# Patient Record
Sex: Female | Born: 1935 | Race: Black or African American | Hispanic: No | Marital: Single | Smoking: Never smoker
Health system: Southern US, Community
[De-identification: ages and names within clinical notes are randomized; demographics above are authoritative.]

## PROBLEM LIST (undated history)

## (undated) DIAGNOSIS — E119 Type 2 diabetes mellitus without complications: Secondary | ICD-10-CM

## (undated) DIAGNOSIS — N289 Disorder of kidney and ureter, unspecified: Secondary | ICD-10-CM

## (undated) DIAGNOSIS — I10 Essential (primary) hypertension: Secondary | ICD-10-CM

## (undated) DIAGNOSIS — I2510 Atherosclerotic heart disease of native coronary artery without angina pectoris: Secondary | ICD-10-CM

## (undated) HISTORY — PX: CORONARY ANGIOPLASTY WITH STENT PLACEMENT: SHX49

## (undated) HISTORY — PX: CARDIAC CATHETERIZATION: SHX172

---

## 2008-08-03 ENCOUNTER — Encounter (HOSPITAL_COMMUNITY): Admission: RE | Admit: 2008-08-03 | Discharge: 2008-08-29 | Payer: Self-pay | Admitting: Family Medicine

## 2008-08-03 ENCOUNTER — Ambulatory Visit (HOSPITAL_COMMUNITY): Admission: RE | Admit: 2008-08-03 | Discharge: 2008-08-03 | Payer: Self-pay | Admitting: Family Medicine

## 2008-09-20 ENCOUNTER — Encounter (HOSPITAL_COMMUNITY): Admission: RE | Admit: 2008-09-20 | Discharge: 2008-10-20 | Payer: Self-pay | Admitting: Family Medicine

## 2008-09-22 ENCOUNTER — Emergency Department (HOSPITAL_COMMUNITY): Admission: EM | Admit: 2008-09-22 | Discharge: 2008-09-22 | Payer: Self-pay | Admitting: Emergency Medicine

## 2008-10-04 ENCOUNTER — Ambulatory Visit (HOSPITAL_COMMUNITY): Admission: RE | Admit: 2008-10-04 | Discharge: 2008-10-04 | Payer: Self-pay | Admitting: Family Medicine

## 2008-10-16 ENCOUNTER — Ambulatory Visit: Payer: Self-pay | Admitting: Vascular Surgery

## 2008-11-20 ENCOUNTER — Emergency Department (HOSPITAL_COMMUNITY): Admission: EM | Admit: 2008-11-20 | Discharge: 2008-11-20 | Payer: Self-pay | Admitting: Emergency Medicine

## 2008-12-10 ENCOUNTER — Ambulatory Visit (HOSPITAL_COMMUNITY): Admission: RE | Admit: 2008-12-10 | Discharge: 2008-12-10 | Payer: Self-pay | Admitting: Vascular Surgery

## 2008-12-10 ENCOUNTER — Ambulatory Visit: Payer: Self-pay | Admitting: Vascular Surgery

## 2009-01-08 ENCOUNTER — Ambulatory Visit: Payer: Self-pay | Admitting: Vascular Surgery

## 2009-02-18 ENCOUNTER — Inpatient Hospital Stay (HOSPITAL_COMMUNITY): Admission: EM | Admit: 2009-02-18 | Discharge: 2009-02-21 | Payer: Self-pay | Admitting: Emergency Medicine

## 2009-06-10 ENCOUNTER — Emergency Department (HOSPITAL_COMMUNITY): Admission: EM | Admit: 2009-06-10 | Discharge: 2009-06-10 | Payer: Self-pay | Admitting: Emergency Medicine

## 2010-08-28 ENCOUNTER — Emergency Department (HOSPITAL_COMMUNITY)
Admission: EM | Admit: 2010-08-28 | Discharge: 2010-08-28 | Payer: Self-pay | Source: Home / Self Care | Admitting: Emergency Medicine

## 2010-09-21 ENCOUNTER — Encounter: Payer: Self-pay | Admitting: Family Medicine

## 2010-12-08 LAB — GLUCOSE, CAPILLARY
Glucose-Capillary: 112 mg/dL — ABNORMAL HIGH (ref 70–99)
Glucose-Capillary: 118 mg/dL — ABNORMAL HIGH (ref 70–99)
Glucose-Capillary: 169 mg/dL — ABNORMAL HIGH (ref 70–99)
Glucose-Capillary: 178 mg/dL — ABNORMAL HIGH (ref 70–99)
Glucose-Capillary: 193 mg/dL — ABNORMAL HIGH (ref 70–99)
Glucose-Capillary: 207 mg/dL — ABNORMAL HIGH (ref 70–99)
Glucose-Capillary: 83 mg/dL (ref 70–99)
Glucose-Capillary: 86 mg/dL (ref 70–99)

## 2010-12-08 LAB — DIFFERENTIAL
Basophils Absolute: 0 10*3/uL (ref 0.0–0.1)
Basophils Absolute: 0 10*3/uL (ref 0.0–0.1)
Basophils Relative: 1 % (ref 0–1)
Eosinophils Absolute: 0 10*3/uL (ref 0.0–0.7)
Eosinophils Absolute: 0 10*3/uL (ref 0.0–0.7)
Eosinophils Absolute: 0.1 10*3/uL (ref 0.0–0.7)
Eosinophils Absolute: 0.1 10*3/uL (ref 0.0–0.7)
Eosinophils Absolute: 0.1 10*3/uL (ref 0.0–0.7)
Eosinophils Relative: 1 % (ref 0–5)
Eosinophils Relative: 2 % (ref 0–5)
Lymphocytes Relative: 25 % (ref 12–46)
Lymphocytes Relative: 26 % (ref 12–46)
Lymphs Abs: 0.9 10*3/uL (ref 0.7–4.0)
Lymphs Abs: 1 10*3/uL (ref 0.7–4.0)
Lymphs Abs: 1 10*3/uL (ref 0.7–4.0)
Lymphs Abs: 1.1 10*3/uL (ref 0.7–4.0)
Monocytes Absolute: 0.3 10*3/uL (ref 0.1–1.0)
Monocytes Absolute: 0.3 10*3/uL (ref 0.1–1.0)
Monocytes Absolute: 0.4 10*3/uL (ref 0.1–1.0)
Monocytes Relative: 10 % (ref 3–12)
Monocytes Relative: 11 % (ref 3–12)
Monocytes Relative: 7 % (ref 3–12)
Monocytes Relative: 8 % (ref 3–12)
Neutro Abs: 2.1 10*3/uL (ref 1.7–7.7)
Neutro Abs: 2.3 10*3/uL (ref 1.7–7.7)
Neutro Abs: 2.3 10*3/uL (ref 1.7–7.7)
Neutrophils Relative %: 53 % (ref 43–77)
Neutrophils Relative %: 57 % (ref 43–77)
Neutrophils Relative %: 57 % (ref 43–77)
Promyelocytes Absolute: 0 %
nRBC: 0 /100 WBC

## 2010-12-08 LAB — CBC
HCT: 28 % — ABNORMAL LOW (ref 36.0–46.0)
HCT: 29.3 % — ABNORMAL LOW (ref 36.0–46.0)
HCT: 31.5 % — ABNORMAL LOW (ref 36.0–46.0)
Hemoglobin: 10.1 g/dL — ABNORMAL LOW (ref 12.0–15.0)
Hemoglobin: 10.8 g/dL — ABNORMAL LOW (ref 12.0–15.0)
Hemoglobin: 9.9 g/dL — ABNORMAL LOW (ref 12.0–15.0)
MCHC: 34.5 g/dL (ref 30.0–36.0)
MCHC: 34.5 g/dL (ref 30.0–36.0)
MCV: 90.4 fL (ref 78.0–100.0)
MCV: 90.6 fL (ref 78.0–100.0)
MCV: 90.8 fL (ref 78.0–100.0)
MCV: 91.1 fL (ref 78.0–100.0)
Platelets: 129 10*3/uL — ABNORMAL LOW (ref 150–400)
Platelets: 183 10*3/uL (ref 150–400)
Platelets: 191 10*3/uL (ref 150–400)
Platelets: 198 10*3/uL (ref 150–400)
Platelets: 213 10*3/uL (ref 150–400)
RBC: 3.12 MIL/uL — ABNORMAL LOW (ref 3.87–5.11)
RBC: 3.22 MIL/uL — ABNORMAL LOW (ref 3.87–5.11)
RBC: 3.43 MIL/uL — ABNORMAL LOW (ref 3.87–5.11)
RDW: 14.7 % (ref 11.5–15.5)
RDW: 15.3 % (ref 11.5–15.5)
RDW: 15.4 % (ref 11.5–15.5)
WBC: 3.6 10*3/uL — ABNORMAL LOW (ref 4.0–10.5)
WBC: 3.6 10*3/uL — ABNORMAL LOW (ref 4.0–10.5)
WBC: 3.8 10*3/uL — ABNORMAL LOW (ref 4.0–10.5)
WBC: 3.8 10*3/uL — ABNORMAL LOW (ref 4.0–10.5)
WBC: 8.5 10*3/uL (ref 4.0–10.5)

## 2010-12-08 LAB — BASIC METABOLIC PANEL
BUN: 16 mg/dL (ref 6–23)
BUN: 18 mg/dL (ref 6–23)
BUN: 22 mg/dL (ref 6–23)
CO2: 34 mEq/L — ABNORMAL HIGH (ref 19–32)
Calcium: 9.2 mg/dL (ref 8.4–10.5)
Calcium: 9.3 mg/dL (ref 8.4–10.5)
Calcium: 9.6 mg/dL (ref 8.4–10.5)
Chloride: 98 mEq/L (ref 96–112)
Chloride: 98 mEq/L (ref 96–112)
Creatinine, Ser: 0.9 mg/dL (ref 0.4–1.2)
Creatinine, Ser: 1.51 mg/dL — ABNORMAL HIGH (ref 0.4–1.2)
GFR calc Af Amer: 41 mL/min — ABNORMAL LOW (ref >60)
GFR calc Af Amer: 52 mL/min — ABNORMAL LOW (ref >60)
GFR calc Af Amer: >60 mL/min (ref >60)
GFR calc non Af Amer: 34 mL/min — ABNORMAL LOW (ref >60)
GFR calc non Af Amer: 43 mL/min — ABNORMAL LOW (ref >60)
GFR calc non Af Amer: 57 mL/min — ABNORMAL LOW (ref >60)
GFR calc non Af Amer: >60 mL/min (ref >60)
Glucose, Bld: 149 mg/dL — ABNORMAL HIGH (ref 70–99)
Glucose, Bld: 183 mg/dL — ABNORMAL HIGH (ref 70–99)
Potassium: 3.6 mEq/L (ref 3.5–5.1)
Potassium: 3.7 mEq/L (ref 3.5–5.1)
Sodium: 138 mEq/L (ref 135–145)

## 2010-12-08 LAB — URINALYSIS, ROUTINE W REFLEX MICROSCOPIC
Ketones, ur: NEGATIVE mg/dL
Specific Gravity, Urine: <1.005 — ABNORMAL LOW (ref 1.005–1.030)

## 2010-12-08 LAB — LIPID PANEL
Cholesterol: 124 mg/dL (ref 0–200)
HDL: 44 mg/dL (ref >39)
LDL Cholesterol: 69 mg/dL (ref 0–99)
Total CHOL/HDL Ratio: 2.8 RATIO

## 2010-12-08 LAB — COMPREHENSIVE METABOLIC PANEL
AST: 13 U/L (ref 0–37)
Alkaline Phosphatase: 53 U/L (ref 39–117)
BUN: 18 mg/dL (ref 6–23)
CO2: 33 mEq/L — ABNORMAL HIGH (ref 19–32)
Creatinine, Ser: 0.88 mg/dL (ref 0.4–1.2)
GFR calc non Af Amer: >60 mL/min (ref >60)
Potassium: 3.6 mEq/L (ref 3.5–5.1)
Sodium: 140 mEq/L (ref 135–145)
Total Bilirubin: 0.4 mg/dL (ref 0.3–1.2)
Total Protein: 6.6 g/dL (ref 6.0–8.3)

## 2010-12-08 LAB — PROTIME-INR
INR: 1 (ref 0.00–1.49)
INR: 1.1 (ref 0.00–1.49)
Prothrombin Time: 13.4 seconds (ref 11.6–15.2)
Prothrombin Time: 14.4 seconds (ref 11.6–15.2)

## 2010-12-08 LAB — HEMOGLOBIN A1C: Mean Plasma Glucose: 177 mg/dL

## 2010-12-10 LAB — POCT I-STAT, CHEM 8
BUN: 35 mg/dL — ABNORMAL HIGH (ref 6–23)
Hemoglobin: 11.9 g/dL — ABNORMAL LOW (ref 12.0–15.0)
Potassium: 4.2 mEq/L (ref 3.5–5.1)
Sodium: 137 mEq/L (ref 135–145)
TCO2: 29 mmol/L (ref 0–100)

## 2010-12-10 LAB — GLUCOSE, CAPILLARY
Glucose-Capillary: 109 mg/dL — ABNORMAL HIGH (ref 70–99)
Glucose-Capillary: 54 mg/dL — ABNORMAL LOW (ref 70–99)

## 2010-12-11 LAB — POCT I-STAT, CHEM 8
BUN: 12 mg/dL (ref 6–23)
Calcium, Ion: 0.97 mmol/L — ABNORMAL LOW (ref 1.12–1.32)
Chloride: 104 mEq/L (ref 96–112)
Glucose, Bld: 216 mg/dL — ABNORMAL HIGH (ref 70–99)
TCO2: 25 mmol/L (ref 0–100)

## 2011-01-13 NOTE — H&P (Signed)
[name], [name]             ACCOUNT NO.:  [id number]   MEDICAL RECORD NO.:  [id number]          PATIENT TYPE:  OBV   LOCATION:  A311                          FACILITY:  [facility]   PHYSICIAN:  Renee Ramus, MD       DATE OF BIRTH:  06-Jul-1936   DATE OF ADMISSION:  02/16/2009  DATE OF DISCHARGE:  LH                              HISTORY & PHYSICAL   HISTORY OF PRESENT ILLNESS:  The patient is a 75 year old female, who  noticed approximately 1 week prior to admission increasing swelling in  her left lower extremity.  The patient has been wrapping her left lower  extremity recently, i.e. 1 week prior to admission.  She also stopped  taking her aspirin approximately 1 month prior to admission.  The  patient has known history of a chronic venous stasis ulcer in the  lateral aspect of her left ankle, her left lower extremity, and known  severe peripheral vascular disease, status post stent to her left lower  extremity arteries.  The patient does present with a swollen left lower  extremity without a great deal of pain, but she says that it certainly  becomes more swollen and somewhat painful upon weightbearing activities.  The patient has no previous history of DVT, although she has been  checked for this several times.  The patient be admitted for  observation.   PAST MEDICAL HISTORY:  1. Peripheral vascular disease.  2. Diabetes mellitus type 2, poorly controlled.  3. Hypertension.  4. Hyperlipidemia.  5. Chronic left lower extremity ulcer.  6. Gastroesophageal reflux disease.  7. Anxiety.  8. Coronary artery disease.  9. Chronic kidney disease, stage 1.   SOCIAL HISTORY:  The patient denies alcohol or tobacco use.  She lives  at home.  She has a Warden/ranger.   FAMILY HISTORY:  Not available.   REVIEW OF SYSTEMS:  All other comprehensive review of systems are  negative.   MEDICATIONS:  The patient has allergies to CODEINE and PENICILLIN.   CURRENT MEDICATIONS:  1.  Elavil 25 mg p.o. q.i.d.  2. Metoprolol ER 200 mg p.o. b.i.d.  3. Doxycycline 100 mg p.o. b.i.d.  4. Losartan 100 mg p.o. daily.  5. Oxycodone 5 mg 1 p.o. q.6 h p.r.n. pain.  6. Loratadine 10 mg p.o. daily.  7. Tramadol/acetaminophen 50 mg p.o. q.6 h p.r.n. pain.  8. Omeprazole 20 mg p.o. daily.  9. Metolazone 2.5 mg p.o. daily.  10.Pravastatin 40 mg p.o. nightly.  11.Lasix 40 mg p.o. b.i.d.  12.Norvasc 5 mg p.o. daily.  13.Actos 45 mg p.o. daily.  14.Glipizide 5 mg p.o. daily.  15.Spironolactone 25 mg p.o. daily.  16.Metformin 1 g p.o. b.i.d.  17.Cilostazol 100 mg p.o. b.i.d.   PHYSICAL EXAMINATION:  GENERAL:  Well-developed, well-nourished, elderly  black female, currently in no apparent distress.  VITAL SIGNS:  Blood pressure 140/50, heart rate is 65, respiratory rate  22, temperature is 98.2.  HEENT:  No jugular venous distention or lymphadenopathy.  Oropharynx is  clear.  Mucous membranes pink and moist.  TMs clear bilaterally.  Pupils  equal, reactive  to light and accommodation.  Extraocular muscles are  intact.  CARDIOVASCULAR:  She has a regular rate and rhythm without murmurs,  rubs, or gallops.  PULMONARY:  Lungs are clear to auscultation bilaterally.  ABDOMEN:  Soft, somewhat obese, nontender, nondistended without  hepatosplenomegaly.  Bowel sounds are present.  She has no rebound or  guarding.  EXTREMITIES:  She has a swollen left lower calf, although she has good  distal pulses.  Her foot is warm and well-perfused.  She has evidence of  chronic venous stasis changes more extensive on the left than the right.  Her calf is noticeably swollen with comparison to left and right.  She  has no discernible palpable mass to the posterior aspect of her calf.  NEURO:  Cranial nerves II through XII are grossly intact.  She has no  focal neurological deficits.   STUDIES:  1. Chest x-ray shows no acute disease.  2. Left lower extremity shows no bony abnormalities.  3. EKG  shows normal sinus rhythm.   LABORATORIES:  White count 8.5, H and H 10/31, MCV 91, platelets 129.  Sodium 134, potassium 4.1, chloride 98, bicarb 27, BUN 31, creatinine  1.14, glucose 190, creatinine clearance of 52.   ASSESSMENT AND PLAN:  1. Swollen left lower extremity.  The patient has acute risk factors      for DVT.  We are going to begin empiric treatment with her      Coumadin.  We will obtain a D-dimer and we can get an ultrasound to      the morning, so we will check her ultrasound in the a.m. to verify      DVT.  2. Nonhealing ulcer in the left lower extremity.  We will treat with      elevation, but we will defer compression at this time.  3. Question of the cellulitis.  We will give 1 dose of vancomycin,      check ESR, and CRP, but I see no systemic evidence of cellulitis      and I do not see a visual evidence of cellulitis on this patient.  4. Hypertension.  Continue current medications.  5. Chronic kidney disease, stage 1.  We will check a UA.  6. Diabetes mellitus type 2.  We will hold metformin, Actos, and      glipizide, and treat with Lantus and Aspart.  7. Hyperlipidemia.  We will check full lipid panel and continue statin      therapy.  8. Gastroesophageal reflux disease.  Continue proton pump inhibitor.  9. Anxiety.  Continue Elavil.  10.Coronary artery disease.  We will add aspirin back to her regimen.  11.Peripheral vascular disease.  We will continue with her Pletal.  H      and P was constructed by reviewing past medical history, conferring      with emergency medical room physician, reviewing the emergency      medical record.  Time spent 1 hour.      Renee Ramus, MD  Electronically Signed     JF/MEDQ  D:  02/16/2009  T:  02/17/2009  Job:  [id number]

## 2011-01-13 NOTE — Assessment & Plan Note (Signed)
OFFICE VISIT   JAYDEN, RUDGE  DOB:  1935-09-05                                       01/08/2009  FAOZH#:[id number]   I saw the patient in the office today for continued followup of the  venous stasis ulcer of her left leg.  I had originally seen her on  10/16/2008 at which time the wound measured 4 cm in width and 8 cm in  height.  She underwent an arteriogram on 12/10/2008 and this  demonstrated severe tibial occlusive disease.  She had no significant  aortoiliac occlusive disease.  On the left side the common femoral, deep  femoral, superficial femoral and popliteal arteries were all widely  patent.  However, her anterior tibial artery was occluded and her  dominant runoff was the peroneal artery.  The posterior tibial artery  had diffuse disease but was patent.  Thus there were no options for  revascularization.  I have simply been following her wound.  She comes  in for a routine wound check.   She continues to have some pain associated with the wound and I have  written her a prescription for oxycodone for pain.  She has had no fever  or chills.   PHYSICAL EXAMINATION:  On physical examination blood pressure is 173/72,  heart rate is 76.  She has a palpable femoral pulse on the left.  I  cannot palpate pedal pulses.  She has monophasic signals in the left  foot.  The wound measures 3 cm in width and 8 cm in height and thus it  has decreased in size compared to the previous study.  She has  reasonable granulation tissue.   Unfortunately, I do not think there is anything different to do except  continued dressing changes with mild compression therapy and some  elevation as she tolerates it.  Because of her tibial occlusive disease  we cannot be overly aggressive with her compression therapy nor can she  elevate her leg as much as we would like.  However, I think she can  elevate her leg some and I have recommended she try elevating her leg on  two  pillows and will continue with dressing changes with mild  compression.  As long as the wound continues to move in the right  direction I think we can get this to heal.  If it fails to make progress  then the only other consideration would be placement of a VAC.  I plan  on seeing her back in 4 weeks.  She knows to call sooner if she has  problems.   Di Kindle. Edilia Bo, M.D.  Electronically Signed   CSD/MEDQ  D:  01/08/2009  T:  01/09/2009  Job:  [id number]   cc:   Amedeo Plenty, NP

## 2011-01-13 NOTE — Op Note (Signed)
[name], [name]             ACCOUNT NO.:  [id number]   MEDICAL RECORD NO.:  [id number]          PATIENT TYPE:  AMB   LOCATION:  SDS                          FACILITY:  [facility]   PHYSICIAN:  Di Kindle. Edilia Bo, M.D.DATE OF BIRTH:  05/31/1936   DATE OF PROCEDURE:  12/10/2008  DATE OF DISCHARGE:  12/10/2008                               OPERATIVE REPORT   PREOPERATIVE DIAGNOSIS:  Nonhealing venous stasis ulcer on the left leg.   POSTOPERATIVE DIAGNOSIS:  Nonhealing venous stasis ulcer on the left  leg.   PROCEDURE:  1. Ultrasound-guided access to the right common femoral artery.  2. Aortogram with bilateral lower extremity runoff.  3. Second-order catheterization of the left external iliac artery.  4. Percutaneous closure of right common femoral artery using Perclose      device.   SURGEON:  Di Kindle. Edilia Bo, MD   ANESTHESIA:  Local with sedation.   INDICATIONS:  This is a pleasant 75 year old woman who developed a wound  on the lateral aspect over left leg in late November.  The wound had  been slow to heal.  She had evidence of infrainguinal arterial occlusive  disease on exam and was brought in for diagnostic arteriography to  determine if she is a candidate for revascularization.   TECHNIQUE:  The patient was taken to the [facility] and received 0.5 mg of  Versed and 25 mcg of fentanyl.  She later received an additional 25 mcg  of fentanyl.  In addition, she received 10 mg of labetalol IV for blood  pressure and subsequent received a second 10 mg of labetalol.  The  groins were prepped and draped in the usual sterile fashion.  After the  skin was infiltrated with 1% lidocaine under ultrasound guidance, the  right common femoral artery was cannulated and guidewire was introduced  into the infrarenal aorta under fluoroscopic control.  The 5-French  sheath was introduced over the wire and then a pigtail catheter was  positioned at the L1 vertebral body and flush  aortogram was obtained.  The catheter was then repositioned above the bifurcation and oblique  iliac projection was obtained.  The pigtail catheter was then exchanged  for an IMA catheter which was positioned into the left common iliac  artery.  The wire was advanced down into the external iliac artery and  then the IMA catheter was exchanged for an end-hole catheter.  Next, the  left external iliac arteriogram was obtained with spot films of the left  leg.  The end-hole catheter was then removed over a wire and then right  lower extremity films were obtained to the right femoral sheath.   At the completion of the procedure, the 5-French femoral sheath was  exchanged for the Perclose device over a wire.  The Perclose was  advanced and the wire was removed.  The Perclose was then advanced  further until there was a good return from the marker port.  The foot  was then deployed and then the Perclose retracted until backbleeding  stopped.  The device was deployed and then the suture cut and the foot  re-engaged.  The catheter was then retracted and then attention was held  on the blue suture and then using the enclosed device this was used to  tie the knot and secure it.  The suture was then cut.  The pressure was  held for 5 minutes and there appeared to be good hemostasis.  There were  no immediate complications noted.   FINDINGS:  There are single renal arteries bilaterally with no  significant renal artery stenosis identified.  The infrarenal aorta,  bilateral common iliac arteries, bilateral external iliac arteries, and  common femoral arteries are patent bilaterally.   On the right side, the deep femoral artery is patent.  The superficial  femoral artery is occluded proximally and is reconstitution of the above-  knee popliteal artery.  There is single-vessel runoff on the right via  the anterior tibial artery.  The peroneal and posterior tibial arteries  on the right are  occluded.   On the left side, the common femoral, deep femoral, and superficial  femoral artery are widely patent.  There is single-vessel runoff on the  left via the peroneal artery.   CONCLUSIONS:  1. Right superficial femoral artery occlusive disease.  2. Bilateral tibial artery occlusive disease as described above.      Di Kindle. Edilia Bo, M.D.  Electronically Signed     CSD/MEDQ  D:  12/10/2008  T:  12/11/2008  Job:  [id number]   cc:   Valere Dross, NP

## 2011-01-13 NOTE — Group Therapy Note (Signed)
NAMETENISHIA, EKMAN             ACCOUNT NO.:  [id number]   MEDICAL RECORD NO.:  [id number]          PATIENT TYPE:  OBV   LOCATION:  A311                          FACILITY:  [facility]   PHYSICIAN:  Melissa L. Ladona Ridgel, MD  DATE OF BIRTH:  04/05/1936   DATE OF PROCEDURE:  02/18/2009  DATE OF DISCHARGE:                                 PROGRESS NOTE   Patient states that she had a good day.  Her leg swelling is improved.  She is not having a significant amount of pain in the leg.  She has no  nausea, vomiting, or diarrhea.  No other new symptoms are present.   PHYSICAL EXAMINATION:  VITAL SIGNS:  T max was 98.2, but currently 97.1.  Blood pressure 129/60, pulse 69, respirations 20, saturation ranges from  91 to 99% on room air.  I's and O's are incomplete.  GENERAL:  This is an obese African American female in no acute distress.  She is normocephalic and atraumatic.  Pupils are equal, round and  reactive to light.  Extraocular muscles are intact.  She has anicteric  sclerae.  External ears reveal no discharge.  Examination of the nose  reveals septum midline without discharge.  Examination of the mouth  reveals no oral lesions.  NECK:  Supple.  There is no lymphadenopathy.  No JVD.  No carotid  bruits.  CHEST:  Clear to auscultation.  There are no rales, wheezes, or rhonchi.  CARDIOVASCULAR:  Regular rate and rhythm.  Positive S1 and S2.  No S3 or  S4.  I do appreciate a 1 to 2 out of 6 systolic ejection murmur at the  left sternal border.  ABDOMEN:  Obese, nontender, nondistended with positive bowel sounds.  No  hepatosplenomegaly.  No guarding or rebound.  EXTREMITIES:  A fairly large left lateral malleolar ulcer with no smell  but some purulent material.  Pulses are decreased but present in a  dorsalis pedis, right greater than left.  Power in the upper extremities  is 5/5.  Lower extremities is limited by her wound.   PERTINENT LABORATORY VALUES:  T4 of 1.24, TSH within normal limits  at  2.791, hemoglobin A1C elevated at 7.8.  C-reactive protein is elevated  at 2.5.  Sodium is 138, potassium 3.7, chloride 98, CO2 32, BUN 18,  creatinine 0.9, and glucose is 149.  White count is 3.3 with a  hemoglobin of 9.7, hematocrit 28, platelet count 191, lipid panel within  normal limits with a total cholesterol of 124, ESR is 123.  D-dimer was  0.96.   ASSESSMENT:  1. Venous stasis ulcer:  Will order a wound care consult in the      morning for proper dressings and consider talking with Dr. Edilia Bo,      as it appears that a couple of months ago, the patient had      evaluation for possible VAC intervention, which would probably be      appropriate at this time, as the patient's wound is fairly large.      At this time, I do not see a role  for vancomycin.  There does not      appear to be any cellulitis.  She has fairly significant      hyperpigmented changes.  At this time, we are going to discontinue      her vancomycin.  I will, however, cover for the general bacteria      causing infection of this type of wound.  Patient is PENICILLIN-      allergic, so Levaquin would be appropriate.  2. Diabetes, poorly controlled.  I will add back her glipizide.      Continue her Lantus and sliding scale and try to add back further      oral hypoglycemics as condition changes.  Also will change her diet      to an 1800 ADA diet.  3. Wound care consult, as stated.  4. Hypertension:  Currently stable on her home medications.   Total time on this case was approximately 40 minutes.      Melissa L. Ladona Ridgel, MD  Electronically Signed     MLT/MEDQ  D:  02/18/2009  T:  02/18/2009  Job:  [id number]   cc:   Di Kindle. Edilia Bo, M.D.  [address]  [place]  [place] [address]

## 2011-01-13 NOTE — Consult Note (Signed)
NEW PATIENT CONSULTATION   [name], [name]  DOB:  1935/10/07                                       10/16/2008  ZOXWR#:[id number]   I saw the patient in the office today in consultation concerning a  nonhealing wound of her left leg.  She developed an ulcer along the  lateral aspect of her left leg in late November and has been undergoing  aggressive dressing changes for this since early December.  The wound  has shown no significant improvement and she is sent for vascular  consultation.  Dressing changes have included wet-to-dry dressings with  some mild compression.  She denies any previous history of DVT or  phlebitis that she is aware of.  She did have a venous Doppler study  done at [facility] which showed no evidence of DVT in either  lower extremity.  She also had an arterial Doppler study, however, they  were unable to inflate the cuff because of pain in her leg although they  demonstrated monophasic Doppler signals in both lower extremities.  Of  note, I do not get any clear-cut history of claudication or rest pain in  either lower extremity.  She has had no previous nonhealing wounds.  She  does have shooting pain associated with the left leg wound which is  relieved only with pain medication.   PAST MEDICAL HISTORY:  1. Significant for obesity.  2. Insulin dependent diabetes.  3. Hypertension.  4. Hypercholesterolemia.   She denies any history of previous myocardial infarction or history of  congestive heart failure, although she did have chest problems in 1996  and underwent PTCA at [facility].  She has had no recent symptoms.  At that  time her symptoms were indigestion with left arm pain.   FAMILY HISTORY:  Her father had open heart surgery and had bilateral  amputations, she is not sure at what age.  She had a brother who  required an amputation and also a brother who required open heart  surgery, again, she is not sure at what age.   SOCIAL HISTORY:  She is single.  She does not have any children.  She  quit tobacco in 1996 although she will smoke one to two cigarettes a  year.   REVIEW OF SYSTEMS AND MEDICATIONS:  Are documented on the medical  history form in her chart.   PHYSICAL EXAMINATION:  General:  This is a pleasant 75 year old woman  who appears her stated age.  Vital signs:  Blood pressure is 153/75,  heart rate is 73.  Neck:  Is supple.  There is no cervical  lymphadenopathy.  I do not detect any carotid bruits.  Lungs:  Are clear  bilaterally to auscultation.  Cardiac:  She has a regular rate and  rhythm.  Abdomen:  Obese and difficult to assess.  I cannot palpate an  aneurysm.  She has palpable femoral pulses.  I cannot palpate popliteal  or pedal pulses on either side.  She has monophasic Doppler signals in  both feet.  She has an ulcer along the lateral aspect of her left leg  consistent with a venous stasis ulcer which measures 4 cm in width and 8  cm in height.  She has poor granulation tissue.  There is no significant  drainage.  She has significant hyperpigmentation bilaterally but more  significantly on the left side.  She has brawny induration more so on  the left.  She has no ischemic ulcers on her feet.  Neurologic:  Exam is  nonfocal.   Based on her exam I think she has a venous stasis ulcer on the left leg  and has evidence of significant chronic venous insufficiency.  I have  explained that normally the treatment for this would be elevation and  compression therapy.  However, she has evidence of bilateral  infrainguinal arterial occlusive disease and for this reason elevating  her leg is going to make her ischemia worse, likewise compression could  worsen her ischemia.  Given that she has a nonhealing wound with  diabetes and peripheral vascular disease clearly this is a limb  threatening situation.  I think her best chance for limb salvage is to  see what options she might have for  revascularization although clearly  she would be at increased risk because of her multiple risk factors and  her diabetes.  I have recommended we proceed with arteriography to  further evaluate this and this has been scheduled for February 22.  We  have discussed the indications for arteriography and the potential  complications including but not limited to renal failure, arterial  injury and bleeding.  Obviously we will need to check her creatinine  preoperatively to be sure she has no significant renal insufficiency.  If she is a candidate for revascularization given her multiple risk  factors and previous PTCA I think she will need preoperative cardiac  evaluation.  Again, I have explained to her that this is clearly a limb  threatening situation.  Also please include with her records that get  sent to the [facility] lab her, the admission forms that she filled out with her  list of medications.   Di Kindle. Edilia Bo, M.D.  Electronically Signed   CSD/MEDQ  D:  10/16/2008  T:  10/17/2008  Job:  [id number]   cc:   Paralee Cancel, NP

## 2011-01-13 NOTE — Group Therapy Note (Signed)
[name], [name]             ACCOUNT NO.:  [id number]   MEDICAL RECORD NO.:  [id number]          PATIENT TYPE:  INP   LOCATION:  A311                          FACILITY:  [facility]   PHYSICIAN:  Melissa L. Ladona Ridgel, MD  DATE OF BIRTH:  1936-01-23   DATE OF PROCEDURE:  02/20/2009  DATE OF DISCHARGE:                                 PROGRESS NOTE   OBJECTIVE:  The patient states that her legs still feel a little bit  heavy but much improved.  She had no further episodes of shortness of  breath and she has been eating okay.  She has had a bowel movement and  is feeling somewhat better.  She denies nausea, vomiting and has had no  diarrhea.  Wound care was able to see the patient today and feels that  there is probably outpatient therapy that would benefit this patient and  so we will attempt to make these arrangements for her in the morning.  I  will follow up with radiology with regard to the finding of patchy air  space disease in the right upper lobe.  The patient is already on  antibiotics and at this time I will just confirm whether or not they  feel this represents a pneumonia.  If it does, we may elect oral  antibiotic therapy to go home.  Otherwise I was feeling that we could  likely discontinue antibiotic coverage for the ankle as the main issue  is more peripheral vascular disease with the decreased blood flow.  The  wound, however did have a foul smell initially and is subsequently  improving.   PHYSICAL EXAMINATION:  VITAL SIGNS:  The T-max was 99.1, blood pressure  123/57, pulse 72, respirations 20, saturation 98%.  There is no stool  documented.  However, the patient does report that she had a bowel  movement.  GENERAL: This a very pleasant 75 year old African American female  presents with a nonhealing left lateral malleolar vascular venous stasis  ulcer.   She continues to improve on antibiotic therapy.  She has no evidence for  pulmonary embolus as the source of her  shortness of breath.  She does  have some patchy changes in the lung that may represent a pneumonia.   ASSESSMENT:  1. Patchy air space disease in right upper lobe.  She is currently on      antibiotic therapy which would cover that.  I will talk with      radiology in the morning to see if they would confirm this as a      pneumonia.  2. Leg wound.  Appreciate the wound consult.  It sounds as if we      likely could get her to be seen in wound care clinic and follow up      with Dr. Edilia Bo in the outpatient setting.  3. No pulmonary embolus.  Continue DVT prophylactic dose of Lovenox.  4. Diabetes improved but still with a little spike.  We will continue      her glyburide and Lantus in the evening.   Total time on this case  was 20 minutes.   DISPOSITION:  If all goes well and we are able to arrange outpatient  therapy and there are no changes in her vital signs overnight, I will  consider a discharge to home.      Melissa L. Ladona Ridgel, MD  Electronically Signed     MLT/MEDQ  D:  02/20/2009  T:  02/21/2009  Job:  [id number]

## 2011-01-13 NOTE — Discharge Summary (Signed)
NAMEJORDYAN, [name]             ACCOUNT NO.:  [id number]   MEDICAL RECORD NO.:  [id number]          PATIENT TYPE:  INP   LOCATION:  A311                          FACILITY:  [facility]   PHYSICIAN:  Melissa L. Ladona Ridgel, MD  DATE OF BIRTH:  [birth year]   DATE OF ADMISSION:  02/16/2009  DATE OF DISCHARGE:  06/24/2010LH                               DISCHARGE SUMMARY   DISCHARGE DIAGNOSES:  1. Left lower extremity swelling and pain.  The patient was ruled out      for DVT with an ultrasound.  It was determined that the left      lateral wound to her leg may have been slightly infected and      causing some of this edema.  She had cellulitis and was treated      with one dose of vancomycin.  Subsequently, this was changed to      Levaquin to cover gram negatives, as there was a foul smell, and      she continued on doxycycline IV.  2. Hypertension, controlled.  Metoprolol ER, Lasix 40 mg twice daily,      Norvasc 5 mg daily, Spironolactone 25 mg daily.  3. Diabetes.  The patient's metformin was held on admission, as was      her Actos.  There may have been an element of edema related to the      Actos, as at present she is doing quite well with regard to edema.      I have elected to resume her metformin on February 23, 2009 (that is      Saturday), since she had a CT angiogram of the chest, and I have      asked her to stop her Actos and continue with her glipizide 5 mg      daily.  Her Lantus has been decreased to 50 mg at night because of      low blood sugars in the morning, so I have also asked her to make      that change.  4. Pain control.  She seems to be doing quite well with oxycodone as      needed, and she also has tramadol at home that she can use every 6      hours, but really she has not had a lot of pain unless they are      changing the dressing.  5. Peripheral vascular disease with revascularization.  The patient      will be maintained on her aspirin, Pletal, and at present,  the      patient is not on Plavix, so I would ask Dr. Edilia Bo to please      address this during the last attending's visit if she is to be on      Plavix.  For now, she should be protected with the aspirin.  6. Chronic kidney disease.  On admission, the patient's creatinine was      1.14.  During this admission, we held her metolazone because she      seemed to be diuresing well with the Lasix, and her  creatinine had      risen slightly.  I will ask her to follow up in the next week with      her primary care physician to monitor this and titrate back her      Lasix if necessary.   MEDICATIONS AT THE TIME OF DISCHARGE:  1. Elavil 25 mg four times daily.  2. Metoprolol ER 200 mg twice daily.  3. Doxycycline 100 mg by mouth twice daily x2 more days.  4. Losartan 100 by mouth daily.  5. Oxycodone 5 mg every 6 hours by mouth as needed for pain, moderate      to severe.  6. Tramadol with APAP 50 mg every 6 hours as needed for mild pain.  7. Omeprazole 20 mg once daily by mouth.  8. I will ask her not to take her metolazone if the swelling      increases.  She is to call her primary care physician.  9. Pravastatin 40 mg at bedtime.  10.Lasix 20 mg twice daily.  Please note that this is a change from      her home dose.  She usually takes 40 mg twice daily; however, she      did have a CT angiogram of her chest, and I am noting that the      creatinine has climbed slightly to 1.5, so I will ask her to do      Lasix 20 mg twice daily, in light of the fact that the swelling in      the leg is fairly well controlled and her blood pressure is stable.      I will ask her primary care physician to please follow up on her      renal function in the next week.  11.Norvasc 5 mg once daily by mouth.  I have asked her to stop her      Actos because of the increased swelling in the leg and the fact      that her blood sugars are moderately controlled.  12.Glipizide 5 mg daily.  13.Spironolactone 25  mg daily by mouth.  14.Metformin 1 gm by mouth twice daily will be restarted on February 23, 2009 since she had CT scanning on the 21st.  15.Pletal 100 mg p.o. twice daily.  16.Lantus 50 mg subcutaneously at night.  17.Aspirin 81 mg daily.  18.Levaquin 500 mg by mouth daily x2 more days.   CONSULTATIONS DURING THIS HOSPITAL COURSE:  Wound care who are  recommending if possible that [facility].  If not  possible, obviously aggressive wound care at home with follow up with  Dr. Edilia Bo in the next week.   HOSPITAL COURSE:  The patient is a pleasant 75 year old African-American  female who presented to the emergency room with increasing swelling of  the left lower extremity.  The patient had been wrapping the left leg as  recent as one week prior to admission.  She states she stopped taking  her aspirin approximately 1 month ago.  The patient has a known history  of chronic venous stasis ulcers and severe peripheral vascular disease  status post stent.  The patient does present with a swollen left leg  without a great deal of pain.  Was having difficulty with weightbearing  activities.  She was admitted to the hospital to rule her out for deep  vein thrombosis.  She underwent ultrasound testing of the leg which was  negative.  We treated the patient with continued diuresis, elevation of  the leg and antibiotic therapy, as the wound itself actually was foul  smelling with a bit of cellulitis.  She initially received doxycycline  and vancomycin.  This was changed to doxycycline and Levaquin.  The  patient continued to have stable extremity.  Wound care was able to see  her and determined that we likely could continue to do outpatient  dressing changes, and that Dr. Edilia Bo should reevaluate her soon to  determine if a vac system would be appropriate.  Since the day of  admission, the wound has improved in its appearance.  It has less  exudate, less smell, less  swelling, and the edges are starting to  involute.   During the hospital day, the patient did complain of some shortness of  breath, and despite the negative Dopplers, I did go ahead and CT scan  her chest, as her risk for PE was significant.  A CT scan of the chest  showed no PE.  It did show some right upper lobe air space disease, but  I reviewed this with the radiologist on call, and he stated he felt this  was probably just a little bit of scarring.  There is no evidence for  pneumonia.  The patient subsequently had no further shortness of breath.  She has been able to assist with her activities of daily living and has  access to home health for wound care.   PHYSICAL EXAMINATION ON DISCHARGE:  VITAL SIGNS:  On the day of  discharge, the patient's vital signs are stable.  T max was 99.1, with a  temperature of 97.4, blood pressure 121/61, pulse 72, respirations 20,  saturation 95%.  GENERAL:  This is a moderately obese 75 year old African-American female  in no acute distress.  HEENT:  She is normocephalic and atraumatic.  Pupils equal, round and  reactive to light.  Extraocular muscles are intact.  Mucous membranes  are moist.  NECK:  Supple.  There is no JVD, no lymphadenopathy, no carotid bruits.  CHEST:  Clear to auscultation.  There is no rhonchi, rales or wheezes.  CARDIOVASCULAR:  Regular rate and rhythm.  Positive S1 and S2.  No S3 or  sinus rhythm.  No murmurs, rubs, or gallops.  ABDOMEN:  Soft, nontender, nondistended with positive bowel sounds.  EXTREMITIES:  A large full thickness left lateral malleolar wound which  is actually looking a little bit better.  There is less drainage.  There  is less odor.  It is still a little bit tender, but not as much.  She  has 2+ radial pulses, 1+ peripheral pulses in the feet.   PERTINENT LABORATORIES DURING THE COURSE OF THE HOSPITAL STAY:  Sodium  138, potassium 3.4, chloride 98, CO2 is 36, BUN 11, creatinine is 1.51,  which is  up from her baseline at admission, and her glucose was 83 this  morning.  Her CBC was 3.6 with a hemoglobin of 10.1, hematocrit 29.1 and  platelets of 213.  T4 was 1.24, TSH was 2.791.  CRP was 2.5.  Hemoglobin  A1c was 7.8.  Lipid profile showed a total cholesterol of 124,  triglycerides 54, HDL 44, LDL 69 and ESR was 123.  PT was 14.4 with an  INR of 1.1.  D-dimer was 0.98.   EKG on admission showed normal sinus rhythm.  No ST-T wave changes were  noted.   RADIOLOGICAL STUDIES:  As stated, CT angiogram  on the 22nd revealed no  pulmonary emboli and some scarring in the right upper lobe.  Venous  ultrasound showed no obvious DVT.  Tibial x-ray showed no bony  abnormalities to the left lower leg, and a chest x-ray showed some  atelectasis but no obvious infiltrates.   At this time, the patient is deemed stable for discharge to home to  follow up with Dr. Edilia Bo as an outpatient in the next week, and her  primary care physician, whom I would like to have reevaluate her kidney  function status post a CT scan.   The patient will have home health changing her dressing.   DISPOSITION:  To home.   CONDITION:  Stable.   The total time on this dictation and discharge was 45 minutes.      Melissa L. Ladona Ridgel, MD  Electronically Signed     MLT/MEDQ  D:  02/21/2009  T:  02/21/2009  Job:  [id number]   cc:   Di Kindle. Edilia Bo, M.D.  [address]  [place]  [place] [address]   Amedeo Plenty

## 2011-01-13 NOTE — Group Therapy Note (Signed)
[name], [name]             ACCOUNT NO.:  [id number]   MEDICAL RECORD NO.:  [id number]          PATIENT TYPE:  INP   LOCATION:  A311                          FACILITY:  [facility]   PHYSICIAN:  Melissa L. Ladona Ridgel, MD  DATE OF BIRTH:  1935/09/22   DATE OF PROCEDURE:  02/19/2009  DATE OF DISCHARGE:                                 PROGRESS NOTE   SUBJECTIVE:  The patient states that her leg feels okay today.  It is  still tender but does feel a little bit heavy, but is less swollen.  She  has no new complaint.  She is eating okay.  She did not have any bowel  movement today.  She denies any fever or chills.  She also describes no  nausea or vomiting.  I reviewed her case with Dr. Durwin Nora today.  We will  have Wound Care re-evaluate her in the morning and if we are able to  care for her as an outpatient, she will be discharged to home.  However,  at this time, I feel it in her best interest to continue IV antibiotics  so we can determine whether a VAC is needed to repair this wound since  it is large and does have a little bit of purulent material associated  with it.  Please note that the patient did describe feeling a little bit  short of breath this morning that resolved after she got up and around.  I did note that her room air saturations on 2 occasions had been in the  low 90s.  She has not, however, been tachycardiac.   OBJECTIVE:  Currently, the patient' vital signs:  T-max was 99.2.  She  is currently 97.5.  Blood pressure 114/58, pulse 65, respirations 18,  saturation 93% to 97%.  GENERAL:  This is a pleasant, African American female who presents with  leg swelling and pain, continued nonhealing left lateral oral malleolar  ulcer secondary to peripheral vascular disease.  The patient is  currently receiving IV antibiotic therapy as the wound is initially  purulent and a little bit foul-smelling.  After Wound Care sees her in  the morning, we will decide if she an continue this as  outpatient.  However, at this time, it is prudent for her to receive IV antibiotic  therapy.  HEENT:  Normocephalic, atraumatic.  Pupils equal, round, and reactive to  light.  Extraocular muscles are intact.  Mucous membranes are moist.  NECK:  Supple with no JVD, no lymph nodes, no carotid bruits.  CHEST:  Clear to auscultation.  There is no rhonchi, rales, or wheeze.  CARDIOVASCULAR:  Regular rate and rhythm.  Positive S1 and S2.  No S3,  no S4.  No murmurs, rubs, or gallop.  ABDOMEN:  Obese, nontender, nondistended with positive bowel sounds.  EXTREMITIES:  Shows a fairly significantly sized stasis ulcer with less  of a foul smell today.  She does have a significant amount of drainage  from the site but continues to do reasonably well.   PERTINENT LABORATORY VALUES:  Her sodium today is 138, potassium 3.6,  chloride  99, CO2 35, glucose is 99, BUN is 15, creatinine 0.97.   CBC:  White count 3.8 with hemoglobin 9.9, hematocrit 28.6, and  platelets of 210,000.  Urinalysis has been negative.   ASSESSMENT AND PLAN:  This is a 75 year old African American female with  a left lateral malleolar stasis ulcer who presents with the edema and  swelling.  She has been ruled out for deep vein thrombosis and is  currently receiving IV antibiotic therapy, wound culture with wound  consult will be completed hopefully tomorrow and a decision made as to  whether or not this wound is amenable to home therapy or whether or not  she will need to move on to see Dr. Durwin Nora for possible Wound VAC.  The  patient's Doppler ultrasound was negative.  However, with a complaint of  shortness of breath, I am inclined to go ahead and do a CT of her chest  to rule out possible PE as she does have some lower saturations at this  time.  1. Lateral malleolar ulcer.  Continue localized wound care and IV      antibiotics.  We will continue the dressing changes for now until      seen by the Wound Care team.  2.  Diabetes which is poorly controlled.  I added back her glipizide      yesterday and currently her CBG this morning was 74.  We will      continue to monitor her.  She may need adjustment to her Lantus a      little bit during the night.  Currently, she is receiving Lantus 60      units at bedtime.  I think I will go ahead and decrease that down      to at least 50 so that she is not hypoglycemic in the morning.  Her      diet has been changed to 1800 ADA.  3. Hypertension.  She has been currently stable on her current home      medications.   TOTAL TIME ON THIS CASE:  30 minutes.      Melissa L. Ladona Ridgel, MD  Electronically Signed     MLT/MEDQ  D:  02/19/2009  T:  02/19/2009  Job:  [id number]

## 2013-10-18 ENCOUNTER — Encounter (HOSPITAL_COMMUNITY): Payer: Self-pay | Admitting: Emergency Medicine

## 2013-10-18 ENCOUNTER — Emergency Department (HOSPITAL_COMMUNITY)
Admission: EM | Admit: 2013-10-18 | Discharge: 2013-10-18 | Disposition: A | Discharge status: Home / Self Care | Payer: Medicare Other | Attending: Emergency Medicine | Admitting: Emergency Medicine

## 2013-10-18 ENCOUNTER — Emergency Department (HOSPITAL_COMMUNITY): Payer: Medicare Other

## 2013-10-18 DIAGNOSIS — J00 Acute nasopharyngitis [common cold]: Secondary | ICD-10-CM | POA: Insufficient documentation

## 2013-10-18 DIAGNOSIS — Z794 Long term (current) use of insulin: Secondary | ICD-10-CM | POA: Insufficient documentation

## 2013-10-18 DIAGNOSIS — Z91040 Latex allergy status: Secondary | ICD-10-CM | POA: Insufficient documentation

## 2013-10-18 DIAGNOSIS — E119 Type 2 diabetes mellitus without complications: Secondary | ICD-10-CM | POA: Insufficient documentation

## 2013-10-18 DIAGNOSIS — E669 Obesity, unspecified: Secondary | ICD-10-CM | POA: Insufficient documentation

## 2013-10-18 DIAGNOSIS — Z880 Allergy status to penicillin: Secondary | ICD-10-CM | POA: Insufficient documentation

## 2013-10-18 DIAGNOSIS — Z9889 Other specified postprocedural states: Secondary | ICD-10-CM | POA: Insufficient documentation

## 2013-10-18 DIAGNOSIS — R5383 Other fatigue: Principal | ICD-10-CM

## 2013-10-18 DIAGNOSIS — R5381 Other malaise: Secondary | ICD-10-CM | POA: Insufficient documentation

## 2013-10-18 DIAGNOSIS — Z79899 Other long term (current) drug therapy: Secondary | ICD-10-CM | POA: Insufficient documentation

## 2013-10-18 DIAGNOSIS — R531 Weakness: Secondary | ICD-10-CM

## 2013-10-18 DIAGNOSIS — Z9861 Coronary angioplasty status: Secondary | ICD-10-CM | POA: Insufficient documentation

## 2013-10-18 DIAGNOSIS — I10 Essential (primary) hypertension: Secondary | ICD-10-CM | POA: Insufficient documentation

## 2013-10-18 DIAGNOSIS — R6883 Chills (without fever): Secondary | ICD-10-CM | POA: Insufficient documentation

## 2013-10-18 HISTORY — DX: Type 2 diabetes mellitus without complications: E11.9

## 2013-10-18 HISTORY — DX: Essential (primary) hypertension: I10

## 2013-10-18 LAB — CBC WITH DIFFERENTIAL/PLATELET
BASOS PCT: 0 % (ref 0–1)
Basophils Absolute: 0 10*3/uL (ref 0.0–0.1)
EOS ABS: 0 10*3/uL (ref 0.0–0.7)
EOS PCT: 1 % (ref 0–5)
HCT: 33.6 % — ABNORMAL LOW (ref 36.0–46.0)
Hemoglobin: 11.3 g/dL — ABNORMAL LOW (ref 12.0–15.0)
Lymphocytes Relative: 20 % (ref 12–46)
Lymphs Abs: 0.9 10*3/uL (ref 0.7–4.0)
MCH: 30.9 pg (ref 26.0–34.0)
MCHC: 33.6 g/dL (ref 30.0–36.0)
MCV: 91.8 fL (ref 78.0–100.0)
Monocytes Absolute: 0.2 10*3/uL (ref 0.1–1.0)
Monocytes Relative: 5 % (ref 3–12)
NEUTROS PCT: 74 % (ref 43–77)
Neutro Abs: 3.4 10*3/uL (ref 1.7–7.7)
PLATELETS: 187 10*3/uL (ref 150–400)
RBC: 3.66 MIL/uL — ABNORMAL LOW (ref 3.87–5.11)
RDW: 13 % (ref 11.5–15.5)
WBC: 4.6 10*3/uL (ref 4.0–10.5)

## 2013-10-18 LAB — URINALYSIS, ROUTINE W REFLEX MICROSCOPIC
Bilirubin Urine: NEGATIVE
HGB URINE DIPSTICK: NEGATIVE
KETONES UR: NEGATIVE mg/dL
LEUKOCYTES UA: NEGATIVE
Nitrite: NEGATIVE
PROTEIN: 100 mg/dL — AB
Specific Gravity, Urine: >1.03 — ABNORMAL HIGH (ref 1.005–1.030)
UROBILINOGEN UA: 0.2 mg/dL (ref 0.0–1.0)
pH: 6 (ref 5.0–8.0)

## 2013-10-18 LAB — GLUCOSE, CAPILLARY: Glucose-Capillary: 311 mg/dL — ABNORMAL HIGH (ref 70–99)

## 2013-10-18 LAB — URINE MICROSCOPIC-ADD ON

## 2013-10-18 LAB — BASIC METABOLIC PANEL
BUN: 21 mg/dL (ref 6–23)
CALCIUM: 9.6 mg/dL (ref 8.4–10.5)
CO2: 24 mEq/L (ref 19–32)
Chloride: 95 mEq/L — ABNORMAL LOW (ref 96–112)
Creatinine, Ser: 1.49 mg/dL — ABNORMAL HIGH (ref 0.50–1.10)
GFR, EST AFRICAN AMERICAN: 38 mL/min — AB (ref >90)
GFR, EST NON AFRICAN AMERICAN: 33 mL/min — AB (ref >90)
Glucose, Bld: 312 mg/dL — ABNORMAL HIGH (ref 70–99)
POTASSIUM: 4.3 meq/L (ref 3.7–5.3)
SODIUM: 134 meq/L — AB (ref 137–147)

## 2013-10-18 LAB — TROPONIN I: Troponin I: <0.3 ng/mL (ref <0.30)

## 2013-10-18 NOTE — ED Provider Notes (Signed)
CSN: [id number]     Arrival date & time 10/18/13  1415 History  This chart was scribed for Donnetta Hutching, MD by Leone Payor, ED Scribe. This patient was seen in room APA09/APA09 and the patient's care was started 3:22 PM.    Chief Complaint  Patient presents with  . Weakness     The history is provided by the patient and a relative. No language interpreter was used.    HPI Comments: [name] is a 78 y.o. female who presents to the Emergency Department complaining of generalized weakness for the past few days. Pt had a cardiac catheterization and angioplasty on LAD 1 week ago in  [place]. She reports 3 other visits to [facility] ED since then. Pt also reports having fluctuating blood pressures with the highest reading of "250/195" yesterday in the [facility] ED. She also reports blood glucose levels to be in the 300-400's. She currently reports feeling cold and having chills. She denies chest pain, SOB, fever, nausea, vomiting, diarrhea, dysuria   Past Medical History  Diagnosis Date  . Hypertension   . Diabetes mellitus without complication    Past Surgical History  Procedure Laterality Date  . Cardiac catheterization    . Coronary angioplasty with stent placement     No family history on file. History  Substance Use Topics  . Smoking status: Never Smoker   . Smokeless tobacco: Not on file  . Alcohol Use: No   OB History   Grav Para Term Preterm Abortions TAB SAB Ect Mult Living                 Review of Systems  A complete 10 system review of systems was obtained and all systems are negative except as noted in the HPI and PMH.    Allergies  Erythromycin; Latex; Sulfa antibiotics; Penicillins; and Tylenol with codeine #3  Home Medications   Current Outpatient Rx  Name  Route  Sig  Dispense  Refill  . amLODipine (NORVASC) 10 MG tablet   Oral   Take 10 mg by mouth daily.         . cilostazol (PLETAL) 100 MG tablet   Oral   Take 100 mg by mouth 2 (two) times  daily.         Marland Kitchen docusate sodium (COLACE) 100 MG capsule   Oral   Take 100 mg by mouth 2 (two) times daily.         . furosemide (LASIX) 40 MG tablet   Oral   Take 40 mg by mouth daily as needed (Leg Swelling).         . hydrALAZINE (APRESOLINE) 50 MG tablet   Oral   Take 50 mg by mouth 2 (two) times daily.         Marland Kitchen ibuprofen (ADVIL,MOTRIN) 200 MG tablet   Oral   Take 200-400 mg by mouth every 6 (six) hours as needed for moderate pain.         Marland Kitchen insulin glargine (LANTUS) 100 UNIT/ML injection   Subcutaneous   Inject 30-40 Units into the skin 2 (two) times daily. Takes 40 units in the morning and 30 at bedtime.         . insulin regular (NOVOLIN R,HUMULIN R) 100 units/mL injection   Subcutaneous   Inject 15 Units into the skin 2 (two) times daily before a meal.         . losartan (COZAAR) 100 MG tablet   Oral   Take 100  mg by mouth daily.         . metoprolol succinate (TOPROL-XL) 100 MG 24 hr tablet   Oral   Take 100 mg by mouth daily. Take with or immediately following a meal.         . nitroGLYCERIN (NITROSTAT) 0.4 MG SL tablet   Sublingual   Place 0.4 mg under the tongue every 5 (five) minutes as needed for chest pain.         Marland Kitchen. omeprazole (PRILOSEC) 20 MG capsule   Oral   Take 20 mg by mouth daily.         . pravastatin (PRAVACHOL) 40 MG tablet   Oral   Take 40 mg by mouth at bedtime.         . traMADol (ULTRAM) 50 MG tablet   Oral   Take 50 mg by mouth every 6 (six) hours as needed.         Marland Kitchen. VITAMIN D, CHOLECALCIFEROL, PO   Oral   Take 1 tablet by mouth daily.          BP 148/50  Pulse 76  Temp(Src) 98.8 F (37.1 C) (Oral)  Resp 19  Ht 5\' 5"  (1.651 m)  Wt 234 lb (106.142 kg)  BMI 38.94 kg/m2  SpO2 97% Physical Exam  Nursing note and vitals reviewed. Constitutional: She is oriented to person, place, and time. She appears well-developed and well-nourished. No distress.  Pleasant  HENT:  Head: Normocephalic and  atraumatic.  Eyes: Conjunctivae and EOM are normal. Pupils are equal, round, and reactive to light.  Neck: Normal range of motion. Neck supple.  Cardiovascular: Normal rate, regular rhythm and normal heart sounds.   Pulmonary/Chest: Effort normal and breath sounds normal.  Abdominal: Soft. Bowel sounds are normal.  obese  Musculoskeletal: Normal range of motion. She exhibits edema (2+ peripheral edema ).  Neurological: She is alert and oriented to person, place, and time.  Skin: Skin is warm and dry.  Psychiatric: She has a normal mood and affect. Her behavior is normal.    ED Course  Procedures (including critical care time)  DIAGNOSTIC STUDIES: Oxygen Saturation is 94% on RA, adequate by my interpretation.    COORDINATION OF CARE: 3:25 PM Will order lab work. Discussed treatment plan with pt at bedside and pt agreed to plan.   Labs Review Labs Reviewed  GLUCOSE, CAPILLARY - Abnormal; Notable for the following:    Glucose-Capillary 311 (*)    All other components within normal limits  CBC WITH DIFFERENTIAL - Abnormal; Notable for the following:    RBC 3.66 (*)    Hemoglobin 11.3 (*)    HCT 33.6 (*)    All other components within normal limits  BASIC METABOLIC PANEL - Abnormal; Notable for the following:    Sodium 134 (*)    Chloride 95 (*)    Glucose, Bld 312 (*)    Creatinine, Ser 1.49 (*)    GFR calc non Af Amer 33 (*)    GFR calc Af Amer 38 (*)    All other components within normal limits  URINALYSIS, ROUTINE W REFLEX MICROSCOPIC - Abnormal; Notable for the following:    Specific Gravity, Urine >1.030 (*)    Glucose, UA >1000 (*)    Protein, ur 100 (*)    All other components within normal limits  URINE MICROSCOPIC-ADD ON - Abnormal; Notable for the following:    Squamous Epithelial / LPF FEW (*)    All other components within  normal limits  TROPONIN I   Imaging Review Dg Chest 2 View  10/18/2013   CLINICAL DATA:  Weakness.  EXAM: CHEST  2 VIEW  COMPARISON:  CT  ANGIO CHEST W/CM &/OR WO/CM dated 02/19/2009; DG CHEST 1V PORT dated 02/16/2009  FINDINGS: Mediastinum and hilar structures are normal. Lungs are clear. Heart size normal. No pleural effusion or pneumothorax. Degenerative changes thoracic spine. Thoracic spine scoliosis.  IMPRESSION: No active cardiopulmonary disease.   Electronically Signed   By: Maisie Fus  Register   On: 10/18/2013 16:25    EKG Interpretation    Date/Time:  Wednesday October 18 2013 15:18:22 EST Ventricular Rate:  82 PR Interval:  176 QRS Duration: 128 QT Interval:  434 QTC Calculation: 507 R Axis:   -88 Text Interpretation:  Sinus rhythm with marked sinus arrhythmia Left axis deviation Right bundle branch block Abnormal ECG When compared with ECG of 16-Feb-2009 14:51, Right bundle branch block is now Present Confirmed by Tej Murdaugh  MD, Gordie Crumby ([id number]) on 10/18/2013 4:48:07 PM            MDM   Final diagnoses:  Weakness   Patient is in no acute distress.  Labs, urinalysis, EKG all normal. Systolic blood pressure moderately elevated, but diastolic normal.   Patient has primary care followup.   I personally performed the services described in this documentation, which was scribed in my presence. The recorded information has been reviewed and is accurate.   Donnetta Hutching, MD 10/18/13 [phone]

## 2013-10-18 NOTE — Discharge Instructions (Signed)
Tests today were good. Blood pressure was good. Followup your primary care Dr.    No new medications

## 2013-10-18 NOTE — ED Notes (Addendum)
Pt reports [facility] and chest pain and was discharged last Thursday.  Reports had cardiac cath last Wednesday.  Pt says unsure if she had any stents put in.  Says the cardiologist told her she had 3 blockages and he "opened them up."    Reports since being home pt's cbg has been running in 500's and bp still elevated.  Pt c/o generalized weakness and chills.

## 2013-11-08 ENCOUNTER — Emergency Department (HOSPITAL_COMMUNITY)
Admission: EM | Admit: 2013-11-08 | Discharge: 2013-11-09 | Disposition: A | Discharge status: Home / Self Care | Payer: Medicare Other | Attending: Emergency Medicine | Admitting: Emergency Medicine

## 2013-11-08 ENCOUNTER — Emergency Department (HOSPITAL_COMMUNITY): Payer: Medicare Other

## 2013-11-08 ENCOUNTER — Encounter (HOSPITAL_COMMUNITY): Payer: Self-pay | Admitting: Emergency Medicine

## 2013-11-08 DIAGNOSIS — E042 Nontoxic multinodular goiter: Secondary | ICD-10-CM

## 2013-11-08 DIAGNOSIS — R0602 Shortness of breath: Secondary | ICD-10-CM | POA: Insufficient documentation

## 2013-11-08 DIAGNOSIS — Z91040 Latex allergy status: Secondary | ICD-10-CM | POA: Insufficient documentation

## 2013-11-08 DIAGNOSIS — K429 Umbilical hernia without obstruction or gangrene: Secondary | ICD-10-CM

## 2013-11-08 DIAGNOSIS — I10 Essential (primary) hypertension: Secondary | ICD-10-CM | POA: Insufficient documentation

## 2013-11-08 DIAGNOSIS — Z794 Long term (current) use of insulin: Secondary | ICD-10-CM | POA: Insufficient documentation

## 2013-11-08 DIAGNOSIS — R5383 Other fatigue: Secondary | ICD-10-CM

## 2013-11-08 DIAGNOSIS — R5381 Other malaise: Secondary | ICD-10-CM | POA: Insufficient documentation

## 2013-11-08 DIAGNOSIS — R1033 Periumbilical pain: Secondary | ICD-10-CM | POA: Insufficient documentation

## 2013-11-08 DIAGNOSIS — Z79899 Other long term (current) drug therapy: Secondary | ICD-10-CM | POA: Insufficient documentation

## 2013-11-08 DIAGNOSIS — R109 Unspecified abdominal pain: Secondary | ICD-10-CM

## 2013-11-08 DIAGNOSIS — E119 Type 2 diabetes mellitus without complications: Secondary | ICD-10-CM | POA: Insufficient documentation

## 2013-11-08 DIAGNOSIS — Z880 Allergy status to penicillin: Secondary | ICD-10-CM | POA: Insufficient documentation

## 2013-11-08 LAB — URINALYSIS, ROUTINE W REFLEX MICROSCOPIC
Bilirubin Urine: NEGATIVE
GLUCOSE, UA: NEGATIVE mg/dL
Hgb urine dipstick: NEGATIVE
Ketones, ur: NEGATIVE mg/dL
Leukocytes, UA: NEGATIVE
Nitrite: NEGATIVE
PH: 5 (ref 5.0–8.0)
Protein, ur: NEGATIVE mg/dL
Specific Gravity, Urine: 1.01 (ref 1.005–1.030)
Urobilinogen, UA: 0.2 mg/dL (ref 0.0–1.0)

## 2013-11-08 LAB — CBC WITH DIFFERENTIAL/PLATELET
BASOS ABS: 0 10*3/uL (ref 0.0–0.1)
Basophils Relative: 0 % (ref 0–1)
Eosinophils Absolute: 0.1 10*3/uL (ref 0.0–0.7)
Eosinophils Relative: 1 % (ref 0–5)
HCT: 32.3 % — ABNORMAL LOW (ref 36.0–46.0)
Hemoglobin: 11 g/dL — ABNORMAL LOW (ref 12.0–15.0)
Lymphocytes Relative: 21 % (ref 12–46)
Lymphs Abs: 0.9 10*3/uL (ref 0.7–4.0)
MCH: 31.1 pg (ref 26.0–34.0)
MCHC: 34.1 g/dL (ref 30.0–36.0)
MCV: 91.2 fL (ref 78.0–100.0)
Monocytes Absolute: 0.3 10*3/uL (ref 0.1–1.0)
Monocytes Relative: 7 % (ref 3–12)
NEUTROS ABS: 3.1 10*3/uL (ref 1.7–7.7)
NEUTROS PCT: 71 % (ref 43–77)
Platelets: 133 10*3/uL — ABNORMAL LOW (ref 150–400)
RBC: 3.54 MIL/uL — ABNORMAL LOW (ref 3.87–5.11)
RDW: 12.8 % (ref 11.5–15.5)
WBC: 4.4 10*3/uL (ref 4.0–10.5)

## 2013-11-08 LAB — COMPREHENSIVE METABOLIC PANEL
ALT: 13 U/L (ref 0–35)
AST: 19 U/L (ref 0–37)
Albumin: 3.5 g/dL (ref 3.5–5.2)
Alkaline Phosphatase: 83 U/L (ref 39–117)
BUN: 31 mg/dL — ABNORMAL HIGH (ref 6–23)
CALCIUM: 9.8 mg/dL (ref 8.4–10.5)
CO2: 25 mEq/L (ref 19–32)
Chloride: 101 mEq/L (ref 96–112)
Creatinine, Ser: 1.79 mg/dL — ABNORMAL HIGH (ref 0.50–1.10)
GFR calc Af Amer: 31 mL/min — ABNORMAL LOW (ref >90)
GFR calc non Af Amer: 27 mL/min — ABNORMAL LOW (ref >90)
Glucose, Bld: 208 mg/dL — ABNORMAL HIGH (ref 70–99)
POTASSIUM: 4.3 meq/L (ref 3.7–5.3)
SODIUM: 138 meq/L (ref 137–147)
Total Protein: 7.5 g/dL (ref 6.0–8.3)

## 2013-11-08 LAB — D-DIMER, QUANTITATIVE: D-Dimer, Quant: 3.41 ug/mL-FEU — ABNORMAL HIGH (ref 0.00–0.48)

## 2013-11-08 LAB — LACTIC ACID, PLASMA: Lactic Acid, Venous: 0.7 mmol/L (ref 0.5–2.2)

## 2013-11-08 LAB — TROPONIN I: Troponin I: <0.3 ng/mL (ref <0.30)

## 2013-11-08 LAB — PRO B NATRIURETIC PEPTIDE: Pro B Natriuretic peptide (BNP): 275 pg/mL (ref 0–450)

## 2013-11-08 MED ORDER — IOHEXOL 300 MG/ML  SOLN
50.0000 mL | Freq: Once | INTRAMUSCULAR | Status: AC | PRN
  Administered 2013-11-08: 50 mL via ORAL

## 2013-11-08 MED ORDER — SODIUM CHLORIDE 0.9 % IV BOLUS (SEPSIS)
500.0000 mL | Freq: Once | INTRAVENOUS | Status: AC
  Administered 2013-11-08: 500 mL via INTRAVENOUS

## 2013-11-08 MED ORDER — IOHEXOL 350 MG/ML SOLN
80.0000 mL | Freq: Once | INTRAVENOUS | Status: AC | PRN
  Administered 2013-11-08: 80 mL via INTRAVENOUS

## 2013-11-08 NOTE — ED Notes (Signed)
Assisted pt to [facility] with stand by assist.

## 2013-11-08 NOTE — ED Notes (Signed)
Pt c/o chills, temp checked 98.6, given another warm blanket.

## 2013-11-08 NOTE — ED Provider Notes (Addendum)
CSN: [id number]     Arrival date & time 11/08/13  2015 History   First MD Initiated Contact with Patient 11/08/13 2017     Chief Complaint  Patient presents with  . Shortness of Breath     (Consider location/radiation/quality/duration/timing/severity/associated sxs/prior Treatment) HPI Comments: Patient presents to the ER for evaluation of shortness of breath. Patient comes by ambulance. Patient reported improvement, however, by time of arrival to the ER. O2 saturations are normal. She denies chest pain. She has not had any cough.  Patient does, however, not coming that she is experiencing abdominal pain. She reports there is a "lump" in her lower abdomen which has been there for a while, but has become tender. She has not had any nausea, vomiting or diarrhea.  Patient reports that she just got [facility]. She's not sure what they they were treated while she was there. She reports that she had an x-ray, and her blood pressure medications changed.  Patient is a 78 y.o. female presenting with shortness of breath.  Shortness of Breath Associated symptoms: abdominal pain     Past Medical History  Diagnosis Date  . Hypertension   . Diabetes mellitus without complication    Past Surgical History  Procedure Laterality Date  . Cardiac catheterization    . Coronary angioplasty with stent placement     No family history on file. History  Substance Use Topics  . Smoking status: Never Smoker   . Smokeless tobacco: Not on file  . Alcohol Use: No   OB History   Grav Para Term Preterm Abortions TAB SAB Ect Mult Living                 Review of Systems  Respiratory: Positive for shortness of breath.   Gastrointestinal: Positive for abdominal pain.  Neurological: Positive for weakness.  All other systems reviewed and are negative.      Allergies  Erythromycin; Latex; Sulfa antibiotics; Penicillins; and Tylenol with codeine #3  Home  Medications   Current Outpatient Rx  Name  Route  Sig  Dispense  Refill  . amLODipine (NORVASC) 10 MG tablet   Oral   Take 10 mg by mouth daily.         . cilostazol (PLETAL) 100 MG tablet   Oral   Take 100 mg by mouth 2 (two) times daily.         Marland Kitchen. docusate sodium (COLACE) 100 MG capsule   Oral   Take 100 mg by mouth 2 (two) times daily.         . furosemide (LASIX) 40 MG tablet   Oral   Take 40 mg by mouth daily as needed (Leg Swelling).         . hydrALAZINE (APRESOLINE) 50 MG tablet   Oral   Take 50 mg by mouth 2 (two) times daily.         Marland Kitchen. ibuprofen (ADVIL,MOTRIN) 200 MG tablet   Oral   Take 200-400 mg by mouth every 6 (six) hours as needed for moderate pain.         Marland Kitchen. insulin glargine (LANTUS) 100 UNIT/ML injection   Subcutaneous   Inject 30-40 Units into the skin 2 (two) times daily. Takes 40 units in the morning and 30 at bedtime.         . insulin regular (NOVOLIN R,HUMULIN R) 100 units/mL injection   Subcutaneous   Inject 15 Units into the skin 2 (two)  times daily before a meal.         . losartan (COZAAR) 100 MG tablet   Oral   Take 100 mg by mouth daily.         . metoprolol succinate (TOPROL-XL) 100 MG 24 hr tablet   Oral   Take 100 mg by mouth daily. Take with or immediately following a meal.         . nitroGLYCERIN (NITROSTAT) 0.4 MG SL tablet   Sublingual   Place 0.4 mg under the tongue every 5 (five) minutes as needed for chest pain.         Marland Kitchen omeprazole (PRILOSEC) 20 MG capsule   Oral   Take 20 mg by mouth daily.         . pravastatin (PRAVACHOL) 40 MG tablet   Oral   Take 40 mg by mouth at bedtime.         . traMADol (ULTRAM) 50 MG tablet   Oral   Take 50 mg by mouth every 6 (six) hours as needed.         Marland Kitchen VITAMIN D, CHOLECALCIFEROL, PO   Oral   Take 1 tablet by mouth daily.          BP 173/62  Pulse 78  Temp(Src) 98.6 F (37 C) (Oral)  Resp 20  Ht 5\' 5"  (1.651 m)  Wt 218 lb (98.884 kg)  BMI  36.28 kg/m2  SpO2 96% Physical Exam  Constitutional: She is oriented to person, place, and time. She appears well-developed and well-nourished. No distress.  HENT:  Head: Normocephalic and atraumatic.  Right Ear: Hearing normal.  Left Ear: Hearing normal.  Nose: Nose normal.  Mouth/Throat: Oropharynx is clear and moist and mucous membranes are normal.  Eyes: Conjunctivae and EOM are normal. Pupils are equal, round, and reactive to light.  Neck: Normal range of motion. Neck supple.  Cardiovascular: Regular rhythm, S1 normal and S2 normal.  Exam reveals no gallop and no friction rub.   No murmur heard. Pulmonary/Chest: Effort normal and breath sounds normal. No respiratory distress. She exhibits no tenderness.  Abdominal: Soft. Normal appearance and bowel sounds are normal. There is no hepatosplenomegaly. There is tenderness in the periumbilical area. There is no rebound, no guarding, no tenderness at McBurney's point and negative Murphy's sign. No hernia.    Musculoskeletal: Normal range of motion.  Neurological: She is alert and oriented to person, place, and time. She has normal strength. No cranial nerve deficit or sensory deficit. Coordination normal. GCS eye subscore is 4. GCS verbal subscore is 5. GCS motor subscore is 6.  Skin: Skin is warm, dry and intact. No rash noted. No cyanosis.  Psychiatric: She has a normal mood and affect. Her speech is normal and behavior is normal. Thought content normal.    ED Course  Procedures (including critical care time) Labs Review Labs Reviewed  CBC WITH DIFFERENTIAL - Abnormal; Notable for the following:    RBC 3.54 (*)    Hemoglobin 11.0 (*)    HCT 32.3 (*)    Platelets 133 (*)    All other components within normal limits  COMPREHENSIVE METABOLIC PANEL - Abnormal; Notable for the following:    Glucose, Bld 208 (*)    BUN 31 (*)    Creatinine, Ser 1.79 (*)    Total Bilirubin <0.2 (*)    GFR calc non Af Amer 27 (*)    GFR calc Af Amer  31 (*)    All other  components within normal limits  D-DIMER, QUANTITATIVE - Abnormal; Notable for the following:    D-Dimer, Quant 3.41 (*)    All other components within normal limits  PRO B NATRIURETIC PEPTIDE  TROPONIN I  URINALYSIS, ROUTINE W REFLEX MICROSCOPIC  LACTIC ACID, PLASMA   Imaging Review Dg Abd Acute W/chest  11/08/2013   CLINICAL DATA Shortness of breath and abdominal pain  EXAM ACUTE ABDOMEN SERIES (ABDOMEN 2 VIEW & CHEST 1 VIEW)  COMPARISON 10/18/2013 Chest x-ray  FINDINGS Chronic interstitial coarsening. No infiltrate or edema. No effusion or pneumothorax. Normal heart size.  Nonobstructive bowel gas pattern. No abnormal intra-abdominal mass effect or suspicious calcification. Dextroscoliotic curvature of the lumbar spine. No acute osseous findings. Extensive right thigh vascular stenting.  IMPRESSION Negative abdominal radiographs.  No acute cardiopulmonary disease.  SIGNATURE  Electronically Signed   By: Tiburcio Pea M.D.   On: 11/08/2013 21:49     EKG Interpretation   Date/Time:  Wednesday November 08 2013 20:17:50 EDT Ventricular Rate:  70 PR Interval:  182 QRS Duration: 128 QT Interval:  428 QTC Calculation: 462 R Axis:   -68 Text Interpretation:  Sinus rhythm with Premature atrial complexes Left  axis deviation Right bundle branch block Abnormal ECG When compared with  ECG of 18-Oct-2013 15:18, Premature atrial complexes are now Present  Confirmed by Annalisia Ingber  MD, Jedediah Noda [phone]) on 11/08/2013 8:21:05 PM      MDM   Final diagnoses:  None   Patient presents to the ER initially for shortness of breath. Upon arrival to the ER, shortness of breath has resolved. Oxygen saturation is 97% on room air. She is nontender to any chest pain. Cardiac evaluation is unremarkable. She does not appear to be in just heart failure. Patient appears weakened and deconditioned, but no acute findings to explain shortness of breath. It is reassuring that this has improved. A  d-dimer was elevated. She is experiencing renal insufficiency, appears to be somewhat dehydrated. Will be given 500 mL fluid bolus. Discussed with radiology, patient is a candidate for decreased IV contrast dye load CT angiography.  Patient's abdominal pain is unclear etiology. She does have a fullness in the area of the umbilicus which might be a hernia. It is difficult to evaluate by palpation because of her abdominal fat. CT scan will be performed to evaluate for the possibility of incarcerated hernia causing her pain. She does not have an elevated lactate.    Gilda Crease, MD 11/08/13 [id number]  Gilda Crease, MD 11/08/13 [phone]

## 2013-11-08 NOTE — ED Notes (Signed)
Per EMS pt. Called out for SOB. Pt. Was not SOB on arrival. BP 170/90 in route, pt. 97% on room air in route. Pt. States "my belly just feels funny" Pt. Pointing to upper abdominal area. Pt. Denies chest pain, denies dizziness. Pt. Reports bilateral leg weakness x1 month.

## 2013-11-09 LAB — I-STAT TROPONIN, ED: TROPONIN I, POC: 0.02 ng/mL (ref 0.00–0.08)

## 2013-11-09 NOTE — ED Provider Notes (Signed)
2:04 AM resting comfortably, denies any dyspnea or abdominal pain. Abdominal exam has minimal umbilical tenderness without obvious hernia. Good bowel sounds.    She has not had any dyspnea or chest pain in the ER. Serial troponins negative. Presentation does not suggest cardiac etiology.  Patient is unsure, but from what she describes may have had pulmonary function testing performed in [place]. She does have primary care and cardiology followup. She feels comfortable with plan discharge home and agrees to close outpatient followup, in addition to strict return precautions. No indication for admission or further workup at this time. General surgery referral provided.    Results for orders placed during the hospital encounter of 11/08/13  CBC WITH DIFFERENTIAL      Result Value Ref Range   WBC 4.4  4.0 - 10.5 K/uL   RBC 3.54 (*) 3.87 - 5.11 MIL/uL   Hemoglobin 11.0 (*) 12.0 - 15.0 g/dL   HCT 54.0 (*) 98.1 - 19.1 %   MCV 91.2  78.0 - 100.0 fL   MCH 31.1  26.0 - 34.0 pg   MCHC 34.1  30.0 - 36.0 g/dL   RDW 47.8  29.5 - 62.1 %   Platelets 133 (*) 150 - 400 K/uL   Neutrophils Relative % 71  43 - 77 %   Neutro Abs 3.1  1.7 - 7.7 K/uL   Lymphocytes Relative 21  12 - 46 %   Lymphs Abs 0.9  0.7 - 4.0 K/uL   Monocytes Relative 7  3 - 12 %   Monocytes Absolute 0.3  0.1 - 1.0 K/uL   Eosinophils Relative 1  0 - 5 %   Eosinophils Absolute 0.1  0.0 - 0.7 K/uL   Basophils Relative 0  0 - 1 %   Basophils Absolute 0.0  0.0 - 0.1 K/uL  COMPREHENSIVE METABOLIC PANEL      Result Value Ref Range   Sodium 138  137 - 147 mEq/L   Potassium 4.3  3.7 - 5.3 mEq/L   Chloride 101  96 - 112 mEq/L   CO2 25  19 - 32 mEq/L   Glucose, Bld 208 (*) 70 - 99 mg/dL   BUN 31 (*) 6 - 23 mg/dL   Creatinine, Ser 3.08 (*) 0.50 - 1.10 mg/dL   Calcium 9.8  8.4 - 65.7 mg/dL   Total Protein 7.5  6.0 - 8.3 g/dL   Albumin 3.5  3.5 - 5.2 g/dL   AST 19  0 - 37 U/L   ALT 13  0 - 35 U/L   Alkaline Phosphatase 83  39 - 117 U/L    Total Bilirubin <0.2 (*) 0.3 - 1.2 mg/dL   GFR calc non Af Amer 27 (*) >90 mL/min   GFR calc Af Amer 31 (*) >90 mL/min  PRO B NATRIURETIC PEPTIDE      Result Value Ref Range   Pro B Natriuretic peptide (BNP) 275.0  0 - 450 pg/mL  TROPONIN I      Result Value Ref Range   Troponin I <0.30  <0.30 ng/mL  URINALYSIS, ROUTINE W REFLEX MICROSCOPIC      Result Value Ref Range   Color, Urine YELLOW  YELLOW   APPearance CLEAR  CLEAR   Specific Gravity, Urine 1.010  1.005 - 1.030   pH 5.0  5.0 - 8.0   Glucose, UA NEGATIVE  NEGATIVE mg/dL   Hgb urine dipstick NEGATIVE  NEGATIVE   Bilirubin Urine NEGATIVE  NEGATIVE   Ketones, ur NEGATIVE  NEGATIVE mg/dL   Protein, ur NEGATIVE  NEGATIVE mg/dL   Urobilinogen, UA 0.2  0.0 - 1.0 mg/dL   Nitrite NEGATIVE  NEGATIVE   Leukocytes, UA NEGATIVE  NEGATIVE  LACTIC ACID, PLASMA      Result Value Ref Range   Lactic Acid, Venous 0.7  0.5 - 2.2 mmol/L  D-DIMER, QUANTITATIVE      Result Value Ref Range   D-Dimer, Quant 3.41 (*) 0.00 - 0.48 ug/mL-FEU  I-STAT TROPOININ, ED      Result Value Ref Range   Troponin i, poc 0.02  0.00 - 0.08 ng/mL   Comment 3            Dg Chest 2 View  10/18/2013   CLINICAL DATA:  Weakness.  EXAM: CHEST  2 VIEW  COMPARISON:  CT ANGIO CHEST W/CM &/OR WO/CM dated 02/19/2009; DG CHEST 1V PORT dated 02/16/2009  FINDINGS: Mediastinum and hilar structures are normal. Lungs are clear. Heart size normal. No pleural effusion or pneumothorax. Degenerative changes thoracic spine. Thoracic spine scoliosis.  IMPRESSION: No active cardiopulmonary disease.   Electronically Signed   By: Maisie Fus  Register   On: 10/18/2013 16:25   Ct Angio Chest Pe W/cm &/or Wo Cm  11/09/2013   CLINICAL DATA Shortness of breath  EXAM CT ANGIOGRAPHY CHEST WITH CONTRAST  TECHNIQUE Multidetector CT imaging of the chest was performed using the standard protocol during bolus administration of intravenous contrast. Multiplanar CT image reconstructions and MIPs were obtained  to evaluate the vascular anatomy.  CONTRAST 50mL OMNIPAQUE IOHEXOL 300 MG/ML SOLN, 80mL OMNIPAQUE IOHEXOL 350 MG/ML SOLN  COMPARISON 02/19/2009  FINDINGS Heterogeneous thyroid gland with multiple indeterminate nodules.  No pulmonary arterial branch filling defect.  Scattered atherosclerotic disease of the aorta and branch vessels without aneurysmal dilatation.  Heart size upper normal to mildly enlarged. Coronary artery calcifications. Trace pericardial fluid versus thickening. No pleural effusion.  No intrathoracic lymphadenopathy.  See separate abdominal CT report.  Central airways are patent. No pneumothorax. Mild peripheral reticular opacities and lung base atelectasis or scarring.  Calcified granuloma right upper and lower lobes and left upper lobe. Adjacent cluster of noncalcified nodules left upper lobe. Biapical bullae.  Multilevel degenerative changes and mild curvature of the spine. No acute osseous finding.  Review of the MIP images confirms the above findings.  IMPRESSION No pulmonary embolism.  Mild lung base opacities, favor atelectasis or scarring.  Coronary artery calcifications.  Sequelae of prior granulomatous disease.  Indeterminate thyroid nodules. Recommend nonemergent ultrasound follow-up.  SIGNATURE  Electronically Signed   By: Jearld Lesch M.D.   On: 11/09/2013 00:00   Ct Abdomen Pelvis W Contrast  11/08/2013   CLINICAL DATA Abdominal pain  EXAM CT ABDOMEN AND PELVIS WITH CONTRAST  TECHNIQUE Multidetector CT imaging of the abdomen and pelvis was performed using the standard protocol following bolus administration of intravenous contrast.  CONTRAST 50mL OMNIPAQUE IOHEXOL 300 MG/ML SOLN, 80mL OMNIPAQUE IOHEXOL 350 MG/ML SOLN  COMPARISON 11/20/2008  FINDINGS See separate chest CT report.  Hypodensity adjacent to the falciform ligament likely reflects focal fat. Small hypodensity right hepatic lobe is indeterminate (series 9, image 10). No radiodense gallstones or biliary ductal  dilatation. No appreciable abnormality of the spleen and adrenal glands. Fatty atrophy of the pancreas.  Symmetric renal enhancement.  No hydroureteronephrosis.  Moderate stool burden. No overt colitis. Appendix within normal limits. No bowel obstruction. No free intraperitoneal air or fluid.  Fat containing periumbilical hernia mild associated fluid and stranding of the herniated  fat.  Thin walled bladder.  Absent uterus.  No adnexal mass.  Scattered atherosclerotic disease of the aorta and branch vessels without aneurysmal dilatation. Partially imaged right femoral artery stent.  Sub cm short axis retroperitoneal and mesenteric lymph nodes.  Rightward curvature lumbar spine. Multilevel degenerative changes, most severe at L5-S1.  IMPRESSION Fat containing periumbilical hernia. Mild associated fluid and stranding of the herniated fat. Correlate clinically if concerned for incarceration. There is no bowel within the hernia.  Moderate colonic stool burden.  No bowel obstruction.  Other nonemergent findings as above.  SIGNATURE  Electronically Signed   By: Jearld LeschAndrew  DelGaizo M.D.   On: 11/08/2013 23:55   Dg Abd Acute W/chest  11/08/2013   CLINICAL DATA Shortness of breath and abdominal pain  EXAM ACUTE ABDOMEN SERIES (ABDOMEN 2 VIEW & CHEST 1 VIEW)  COMPARISON 10/18/2013 Chest x-ray  FINDINGS Chronic interstitial coarsening. No infiltrate or edema. No effusion or pneumothorax. Normal heart size.  Nonobstructive bowel gas pattern. No abnormal intra-abdominal mass effect or suspicious calcification. Dextroscoliotic curvature of the lumbar spine. No acute osseous findings. Extensive right thigh vascular stenting.  IMPRESSION Negative abdominal radiographs.  No acute cardiopulmonary disease.  SIGNATURE  Electronically Signed   By: Tiburcio PeaJonathan  Watts M.D.   On: 11/08/2013 21:49      Sunnie NielsenBrian Myla Mauriello, MD 11/09/13 [phone]

## 2013-11-09 NOTE — Discharge Instructions (Signed)
Hernia A hernia occurs when an internal organ pushes out through a weak spot in the abdominal wall. Hernias most commonly occur in the groin and around the navel. Hernias often can be pushed back into place (reduced). Most hernias tend to get worse over time. Some abdominal hernias can get stuck in the opening (irreducible or incarcerated hernia) and cannot be reduced. An irreducible abdominal hernia which is tightly squeezed into the opening is at risk for impaired blood supply (strangulated hernia). A strangulated hernia is a medical emergency. Because of the risk for an irreducible or strangulated hernia, surgery may be recommended to repair a hernia. CAUSES   Heavy lifting.  Prolonged coughing.  Straining to have a bowel movement.  A cut (incision) made during an abdominal surgery. HOME CARE INSTRUCTIONS   Bed rest is not required. You may continue your normal activities.  Avoid lifting more than 10 pounds (4.5 kg) or straining.  Cough gently. If you are a smoker it is best to stop. Even the best hernia repair can break down with the continual strain of coughing. Even if you do not have your hernia repaired, a cough will continue to aggravate the problem.  Do not wear anything tight over your hernia. Do not try to keep it in with an outside bandage or truss. These can damage abdominal contents if they are trapped within the hernia sac.  Eat a normal diet.  Avoid constipation. Straining over long periods of time will increase hernia size and encourage breakdown of repairs. If you cannot do this with diet alone, stool softeners may be used. SEEK IMMEDIATE MEDICAL CARE IF:   You have a fever.  You develop increasing abdominal pain.  You feel nauseous or vomit.  Your hernia is stuck outside the abdomen, looks discolored, feels hard, or is tender.  You have any changes in your bowel habits or in the hernia that are unusual for you.  You have increased pain or swelling around the  hernia.  You cannot push the hernia back in place by applying gentle pressure while lying down. MAKE SURE YOU:   Understand these instructions.  Will watch your condition.  Will get help right away if you are not doing well or get worse. Document Released: 08/17/2005 Document Revised: 11/09/2011 Document Reviewed: 04/05/2008 [employer] Patient Information 2014 [employer].  

## 2014-04-25 IMAGING — CT CT ANGIO CHEST
3 of 11 series · 9 of 46 positions shown · IV contrast (Omnipaque 300)
Comparison: none

[Series 4: pe 3.0 b40f · axial · 0.64mm/px · z∈[-219,-66]mm · 4 of 86 slices shown]
[im 18/86  lung]
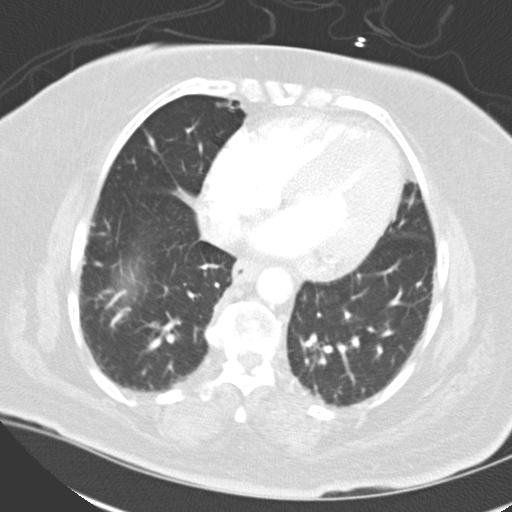
[im 35/86  soft-tissue]
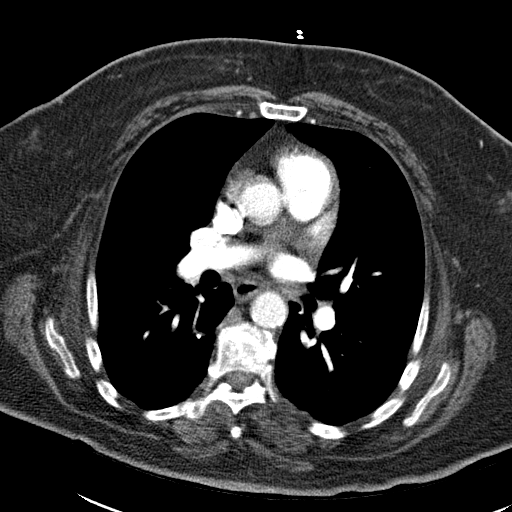
[im 52/86  lung]
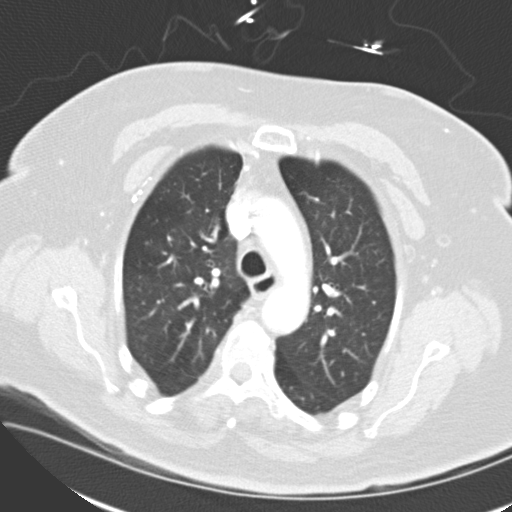
[im 69/86  soft-tissue]
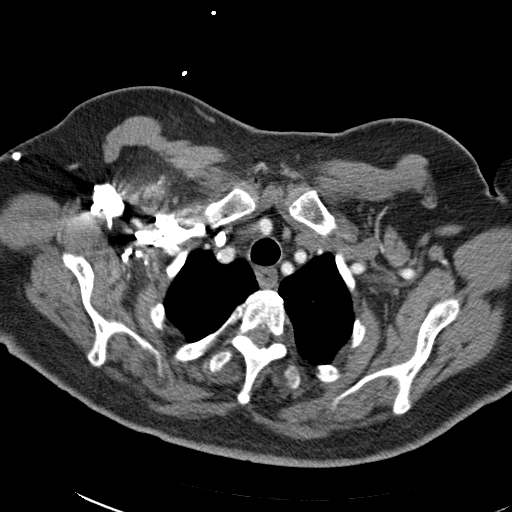

[Series 7: mpr coronal pe cor · coronal · 0.51mm/px · 1 of 86 slices shown]
[im 43/86  soft-tissue]
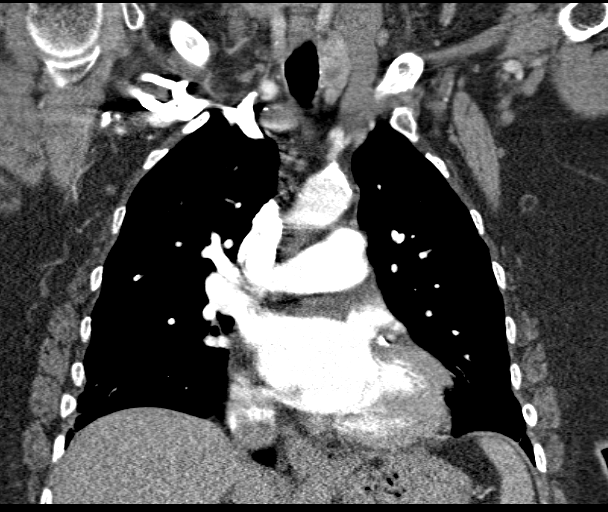

[Series 9: abd_pel 5.0 b40f · axial · 0.80mm/px · z∈[-534,-278]mm · 4 of 86 slices shown]
[im 18/86  lung]
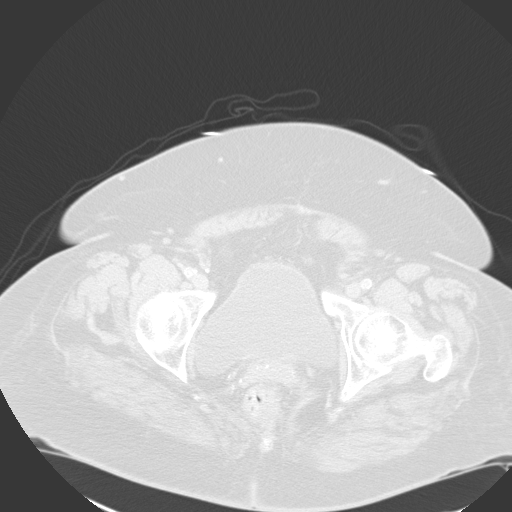
[im 35/86  lung]
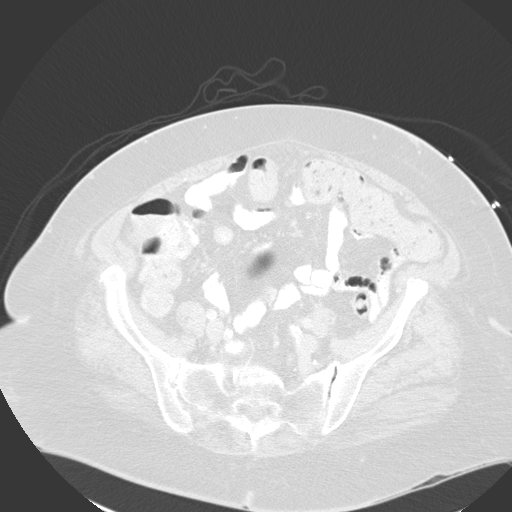
[im 52/86  lung]
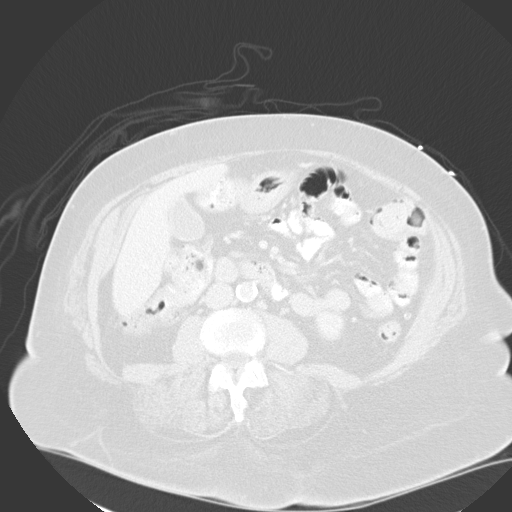
[im 69/86  lung]
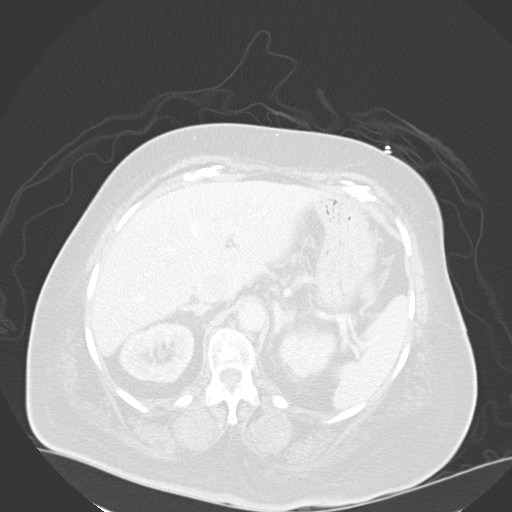

[9 of 46 positions shown; findings below may reference images not displayed]

CLINICAL DATA
Shortness of breath

EXAM
CT ANGIOGRAPHY CHEST WITH CONTRAST

TECHNIQUE
Multidetector CT imaging of the chest was performed using the
standard protocol during bolus administration of intravenous
contrast. Multiplanar CT image reconstructions and MIPs were
obtained to evaluate the vascular anatomy.

CONTRAST
50mL OMNIPAQUE IOHEXOL 300 MG/ML SOLN, 80mL OMNIPAQUE IOHEXOL 350
MG/ML SOLN

COMPARISON
02/19/2009

FINDINGS
Heterogeneous thyroid gland with multiple indeterminate nodules.

No pulmonary arterial branch filling defect.

Scattered atherosclerotic disease of the aorta and branch vessels
without aneurysmal dilatation.

Heart size upper normal to mildly enlarged. Coronary artery
calcifications. Trace pericardial fluid versus thickening. No
pleural effusion.

No intrathoracic lymphadenopathy.

See separate abdominal CT report.

Central airways are patent. No pneumothorax. Mild peripheral
reticular opacities and lung base atelectasis or scarring.

Calcified granuloma right upper and lower lobes and left upper lobe.
Adjacent cluster of noncalcified nodules left upper lobe. Biapical
bullae.

Multilevel degenerative changes and mild curvature of the spine. No
acute osseous finding.

Review of the MIP images confirms the above findings.

IMPRESSION
No pulmonary embolism.

Mild lung base opacities, favor atelectasis or scarring.

Coronary artery calcifications.

Sequelae of prior granulomatous disease.

Indeterminate thyroid nodules. Recommend nonemergent ultrasound
follow-up.

SIGNATURE

## 2016-11-02 ENCOUNTER — Encounter (HOSPITAL_COMMUNITY): Payer: Self-pay | Admitting: Emergency Medicine

## 2016-11-02 ENCOUNTER — Emergency Department (HOSPITAL_COMMUNITY): Payer: Medicare HMO

## 2016-11-02 DIAGNOSIS — I2510 Atherosclerotic heart disease of native coronary artery without angina pectoris: Secondary | ICD-10-CM | POA: Insufficient documentation

## 2016-11-02 DIAGNOSIS — L0231 Cutaneous abscess of buttock: Secondary | ICD-10-CM | POA: Diagnosis not present

## 2016-11-02 DIAGNOSIS — I10 Essential (primary) hypertension: Secondary | ICD-10-CM | POA: Insufficient documentation

## 2016-11-02 DIAGNOSIS — E119 Type 2 diabetes mellitus without complications: Secondary | ICD-10-CM | POA: Diagnosis not present

## 2016-11-02 DIAGNOSIS — Z5321 Procedure and treatment not carried out due to patient leaving prior to being seen by health care provider: Secondary | ICD-10-CM | POA: Insufficient documentation

## 2016-11-02 DIAGNOSIS — Z79899 Other long term (current) drug therapy: Secondary | ICD-10-CM | POA: Insufficient documentation

## 2016-11-02 DIAGNOSIS — L02414 Cutaneous abscess of left upper limb: Secondary | ICD-10-CM | POA: Diagnosis not present

## 2016-11-02 LAB — CBC WITH DIFFERENTIAL/PLATELET
BASOS ABS: 0 10*3/uL (ref 0.0–0.1)
BASOS PCT: 0 %
EOS PCT: 0 %
Eosinophils Absolute: 0 10*3/uL (ref 0.0–0.7)
HCT: 26.9 % — ABNORMAL LOW (ref 36.0–46.0)
Hemoglobin: 8.9 g/dL — ABNORMAL LOW (ref 12.0–15.0)
LYMPHS PCT: 8 %
Lymphs Abs: 0.6 10*3/uL — ABNORMAL LOW (ref 0.7–4.0)
MCH: 30.6 pg (ref 26.0–34.0)
MCHC: 33.1 g/dL (ref 30.0–36.0)
MCV: 92.4 fL (ref 78.0–100.0)
MONO ABS: 0.3 10*3/uL (ref 0.1–1.0)
Monocytes Relative: 4 %
Neutro Abs: 6.5 10*3/uL (ref 1.7–7.7)
Neutrophils Relative %: 88 %
PLATELETS: 123 10*3/uL — AB (ref 150–400)
RBC: 2.91 MIL/uL — AB (ref 3.87–5.11)
RDW: 13.2 % (ref 11.5–15.5)
WBC: 7.4 10*3/uL (ref 4.0–10.5)

## 2016-11-02 LAB — COMPREHENSIVE METABOLIC PANEL
ALBUMIN: 2.9 g/dL — AB (ref 3.5–5.0)
ALK PHOS: 82 U/L (ref 38–126)
ALT: 8 U/L — ABNORMAL LOW (ref 14–54)
ANION GAP: 6 (ref 5–15)
AST: 12 U/L — AB (ref 15–41)
BILIRUBIN TOTAL: 0.5 mg/dL (ref 0.3–1.2)
BUN: 28 mg/dL — AB (ref 6–20)
CALCIUM: 8.6 mg/dL — AB (ref 8.9–10.3)
CO2: 23 mmol/L (ref 22–32)
Chloride: 105 mmol/L (ref 101–111)
Creatinine, Ser: 1.94 mg/dL — ABNORMAL HIGH (ref 0.44–1.00)
GFR calc Af Amer: 27 mL/min — ABNORMAL LOW (ref >60)
GFR, EST NON AFRICAN AMERICAN: 24 mL/min — AB (ref >60)
GLUCOSE: 276 mg/dL — AB (ref 65–99)
POTASSIUM: 4.2 mmol/L (ref 3.5–5.1)
Sodium: 134 mmol/L — ABNORMAL LOW (ref 135–145)
TOTAL PROTEIN: 6.7 g/dL (ref 6.5–8.1)

## 2016-11-02 LAB — PROTIME-INR
INR: 1.01
Prothrombin Time: 13.3 seconds (ref 11.4–15.2)

## 2016-11-02 LAB — LACTIC ACID, PLASMA: LACTIC ACID, VENOUS: 0.7 mmol/L (ref 0.5–1.9)

## 2016-11-02 MED ORDER — ACETAMINOPHEN 500 MG PO TABS
1000.0000 mg | ORAL_TABLET | Freq: Once | ORAL | Status: AC
  Administered 2016-11-02: 1000 mg via ORAL
  Filled 2016-11-02: qty 2

## 2016-11-02 NOTE — ED Triage Notes (Signed)
Pt reports an abscess on her buttocks and her left arm.  States she has been running a fever and feeling nauseated.  Was seen by her pcp this morning and given abx, but they were unable to get her in with a surgeon for I and D.

## 2016-11-03 ENCOUNTER — Encounter (HOSPITAL_COMMUNITY): Payer: Self-pay | Admitting: *Deleted

## 2016-11-03 ENCOUNTER — Emergency Department (HOSPITAL_COMMUNITY)
Admission: EM | Admit: 2016-11-03 | Discharge: 2016-11-03 | Disposition: A | Discharge status: Home / Self Care | Payer: Medicare HMO | Source: Home / Self Care | Attending: Emergency Medicine | Admitting: Emergency Medicine

## 2016-11-03 ENCOUNTER — Emergency Department (HOSPITAL_COMMUNITY)
Admission: EM | Admit: 2016-11-03 | Discharge: 2016-11-03 | Disposition: A | Discharge status: Home / Self Care | Payer: Medicare HMO | Attending: Dermatology | Admitting: Dermatology

## 2016-11-03 DIAGNOSIS — Z794 Long term (current) use of insulin: Secondary | ICD-10-CM

## 2016-11-03 DIAGNOSIS — L0231 Cutaneous abscess of buttock: Secondary | ICD-10-CM

## 2016-11-03 DIAGNOSIS — E119 Type 2 diabetes mellitus without complications: Secondary | ICD-10-CM

## 2016-11-03 DIAGNOSIS — Z7982 Long term (current) use of aspirin: Secondary | ICD-10-CM | POA: Insufficient documentation

## 2016-11-03 DIAGNOSIS — I2510 Atherosclerotic heart disease of native coronary artery without angina pectoris: Secondary | ICD-10-CM | POA: Insufficient documentation

## 2016-11-03 HISTORY — DX: Atherosclerotic heart disease of native coronary artery without angina pectoris: I25.10

## 2016-11-03 HISTORY — DX: Disorder of kidney and ureter, unspecified: N28.9

## 2016-11-03 MED ORDER — LIDOCAINE-EPINEPHRINE (PF) 2 %-1:200000 IJ SOLN
20.0000 mL | Freq: Once | INTRAMUSCULAR | Status: DC
  Filled 2016-11-03: qty 20

## 2016-11-03 MED ORDER — POVIDONE-IODINE 10 % EX SOLN
CUTANEOUS | Status: AC
  Filled 2016-11-03: qty 118

## 2016-11-03 NOTE — ED Notes (Signed)
Pt assisted to rest room.  Became winded after returning and requested oxygen.  States she wears 3L at home "when I need it".  Sats 94% on room air.  Placed on 2L [place] for comfort.

## 2016-11-03 NOTE — ED Triage Notes (Signed)
Pt came here yesterday for same but left because of the wait. Pt has abscess on her buttocks and left arm. She has had fever and nausea. PCP was Editor, commissioningconsulting surgeon for I and D. Pt is on antibiotics.

## 2016-11-03 NOTE — ED Notes (Signed)
Pt has healing abscess to left elbow with no drainage.  Has 5cm hard reddened area to left buttock and 2cm area below left buttock.  No drainage from these areas.  Very painful to movement or touch.

## 2016-11-03 NOTE — ED Provider Notes (Signed)
[facility]-EMERGENCY DEPT Provider Note   CSN: [id number] Arrival date & time: 11/03/16  [id number]   By signing my name below, I, Freida Busmaniana Omoyeni, attest that this documentation has been prepared under the direction and in the presence of Azalia BilisKevin Palin Tristan, MD . Electronically Signed: Freida Busmaniana Omoyeni, Scribe. 11/03/2016. 10:38 AM.  History   Chief Complaint Chief Complaint  Patient presents with  . Abscess    The history is provided by the patient. No language interpreter was used.    HPI Comments:  [name] is a 81 y.o. female with a history of DM, CAD and renal disorder, who presents to the Emergency Department complaining of a golf ball sized boil to her buttocks that's she first noticed a few days ago. She notes moderate pain to the site and states her pain is worse when she sits and with palpation of the area. She reports associated fever yesterday with TMAX of 101.6; fever has resolved at this time. Pt states she came to the ED yesterday for evaluation but left due to the wait. No alleviating factors noted.    Past Medical History:  Diagnosis Date  . Coronary artery disease   . Diabetes mellitus without complication (HCC)   . Hypertension   . Renal disorder     There are no active problems to display for this patient.   Past Surgical History:  Procedure Laterality Date  . CARDIAC CATHETERIZATION    . CORONARY ANGIOPLASTY WITH STENT PLACEMENT      OB History    No data available       Home Medications    Prior to Admission medications   Medication Sig Start Date End Date Taking? Authorizing Provider  albuterol (PROVENTIL HFA;VENTOLIN HFA) 108 (90 Base) MCG/ACT inhaler Inhale 2 puffs into the lungs every 4 (four) hours as needed for wheezing or shortness of breath.   Yes Historical Provider, MD  amLODipine (NORVASC) 10 MG tablet Take 10 mg by mouth daily.   Yes Historical Provider, MD  aspirin EC 81 MG tablet Take 81 mg by mouth daily.   Yes Historical Provider, MD    benzonatate (TESSALON) 200 MG capsule Take 200 mg by mouth 3 (three) times daily as needed for cough.   Yes Historical Provider, MD  cilostazol (PLETAL) 100 MG tablet Take 100 mg by mouth 2 (two) times daily.   Yes Historical Provider, MD  clindamycin (CLEOCIN) 300 MG capsule Take 300 mg by mouth 3 (three) times daily. For 7 days   Yes Historical Provider, MD  docusate sodium (COLACE) 100 MG capsule Take 100 mg by mouth 2 (two) times daily.   Yes Historical Provider, MD  furosemide (LASIX) 40 MG tablet Take 40 mg by mouth daily as needed (Leg Swelling).   Yes Historical Provider, MD  hydrALAZINE (APRESOLINE) 100 MG tablet Take 1 tablet by mouth 3 (three) times daily. 10/20/16  Yes Historical Provider, MD  insulin aspart (NOVOLOG) 100 UNIT/ML FlexPen Inject 15 units subcutaneously before lunch and supper twice a day with meals   Yes Historical Provider, MD  insulin detemir (LEVEMIR) 100 unit/ml SOLN Inject 40 units each morning and 35 units every evening.   Yes Historical Provider, MD  levothyroxine (SYNTHROID, LEVOTHROID) 25 MCG tablet Take 1 tablet by mouth every morning. 10/20/16  Yes Historical Provider, MD  losartan (COZAAR) 100 MG tablet Take 100 mg by mouth daily.   Yes Historical Provider, MD  metoprolol succinate (TOPROL-XL) 100 MG 24 hr tablet Take 100 mg by mouth daily.  Take with or immediately following a meal.   Yes Historical Provider, MD  omeprazole (PRILOSEC) 20 MG capsule Take 20 mg by mouth daily.   Yes Historical Provider, MD  ondansetron (ZOFRAN) 4 MG tablet Take 4 mg by mouth at bedtime.   Yes Historical Provider, MD  pravastatin (PRAVACHOL) 40 MG tablet Take 40 mg by mouth at bedtime.   Yes Historical Provider, MD  pregabalin (LYRICA) 25 MG capsule Take 25 mg by mouth 2 (two) times daily.   Yes Historical Provider, MD  traMADol (ULTRAM) 50 MG tablet Take 50 mg by mouth every 6 (six) hours as needed.   Yes Historical Provider, MD  VITAMIN D, CHOLECALCIFEROL, PO Take 1 tablet by  mouth every 30 (thirty) days.    Yes Historical Provider, MD  ibuprofen (ADVIL,MOTRIN) 200 MG tablet Take 200-400 mg by mouth every 6 (six) hours as needed for moderate pain.    Historical Provider, MD  nitroGLYCERIN (NITROSTAT) 0.4 MG SL tablet Place 0.4 mg under the tongue every 5 (five) minutes as needed for chest pain.    Historical Provider, MD    Family History No family history on file.  Social History Social History  Substance Use Topics  . Smoking status: Never Smoker  . Smokeless tobacco: Never Used  . Alcohol use No     Allergies   Erythromycin; Latex; Sulfa antibiotics; Codeine; Penicillins; and Tylenol with codeine #3 [acetaminophen-codeine]   Review of Systems Review of Systems  Constitutional: Positive for fever.  Skin:       +boil  All other systems reviewed and are negative.    Physical Exam Updated Vital Signs BP 180/64 (BP Location: Right Arm)   Pulse 66   Temp 98.6 F (37 C) (Oral)   Resp 22   Ht 5\' 5"  (1.651 m)   Wt 210 lb (95.3 kg)   SpO2 94%   BMI 34.95 kg/m   Physical Exam  Constitutional: She is oriented to person, place, and time. She appears well-developed and well-nourished. No distress.  HENT:  Head: Normocephalic and atraumatic.  Eyes: EOM are normal.  Neck: Normal range of motion.  Cardiovascular: Normal rate, regular rhythm and normal heart sounds.   Pulmonary/Chest: Effort normal and breath sounds normal.  Abdominal: Soft. She exhibits no distension. There is no tenderness.  Musculoskeletal: Normal range of motion.  Neurological: She is alert and oriented to person, place, and time.  Skin: Skin is warm and dry.  superior left gluteal abscess with fluctuance; no surrounding erythema   Psychiatric: She has a normal mood and affect. Judgment normal.  Nursing note and vitals reviewed.    ED Treatments / Results  DIAGNOSTIC STUDIES:  Oxygen Saturation is 94% on RA, adequate by my interpretation.    COORDINATION OF  CARE:  10:38 AM Discussed treatment plan with pt at bedside and pt agreed to plan.  Labs (all labs ordered are listed, but only abnormal results are displayed) Labs Reviewed - No data to display  EKG  EKG Interpretation None       Radiology Dg Chest 2 View  Result Date: 11/02/2016 CLINICAL DATA:  Fever, buttock abscess.  Cough today. EXAM: CHEST  2 VIEW COMPARISON:  Chest radiograph November 08, 2013 FINDINGS: Cardiac silhouette is mildly enlarged. Mediastinal silhouette is nonsuspicious, calcified aortic knob. Mild bronchitic changes without pleural effusion. Patchy bibasilar airspace opacities. No pneumothorax. Osteopenia. IMPRESSION: Mild cardiomegaly. Mild bronchitic changes with bibasilar atelectasis, less likely pneumonia. Electronically Signed   By: Awilda Metro  M.D.   On: 11/02/2016 18:51    Procedures INCISION AND DRAINAGE Date/Time: 11/03/2016 12:13 PM Performed by: Azalia Bilis Authorized by: Azalia Bilis   Consent:    Consent obtained:  Verbal   Consent given by:  Patient Location:    Type:  Abscess   Location: Left gluteal. Pre-procedure details:    Skin preparation:  Betadine Anesthesia (see MAR for exact dosages):    Anesthesia method:  Local infiltration   Local anesthetic:  Lidocaine 2% WITH epi Procedure type:    Complexity:  Simple Procedure details:    Incision types:  Cruciate   Incision depth:  Subcutaneous   Scalpel blade:  11   Wound management:  Probed and deloculated   Drainage amount:  Moderate   Wound treatment:  Wound left open   Packing materials:  None Post-procedure details:    Patient tolerance of procedure:  Tolerated well, no immediate complications    (including critical care time)  Medications Ordered in ED Medications  lidocaine-EPINEPHrine (XYLOCAINE W/EPI) 2 %-1:200000 (PF) injection 20 mL (not administered)     Initial Impression / Assessment and Plan / ED Course  I have reviewed the triage vital signs and the  nursing notes.  Pertinent labs & imaging results that were available during my care of the patient were reviewed by me and considered in my medical decision making (see chart for details).     Incision and drainage with improvement in symptoms.  Purulent material encountered.  Patient will continue antibiotics at home.  Overall well-appearing.  Stable for discharge.  Final Clinical Impressions(s) / ED Diagnoses   Final diagnoses:  Abscess of left buttock    New Prescriptions New Prescriptions   No medications on file   I personally performed the services described in this documentation, which was scribed in my presence. The recorded information has been reviewed and is accurate.        Azalia Bilis, MD 11/03/16 1229

## 2016-11-03 NOTE — ED Notes (Signed)
Pt informed registration that she was leaving and would come back tomorrow

## 2016-11-08 LAB — CULTURE, BLOOD (ROUTINE X 2)
Culture: NO GROWTH
Culture: NO GROWTH

## 2017-04-19 IMAGING — DX DG CHEST 2V
2 series · 2 of 2 positions shown · non-contrast
Comparison: Chest radiograph November 08, 2013

CLINICAL DATA: Fever, buttock abscess.  Cough today.

EXAM:
CHEST  2 VIEW

[chest pa]
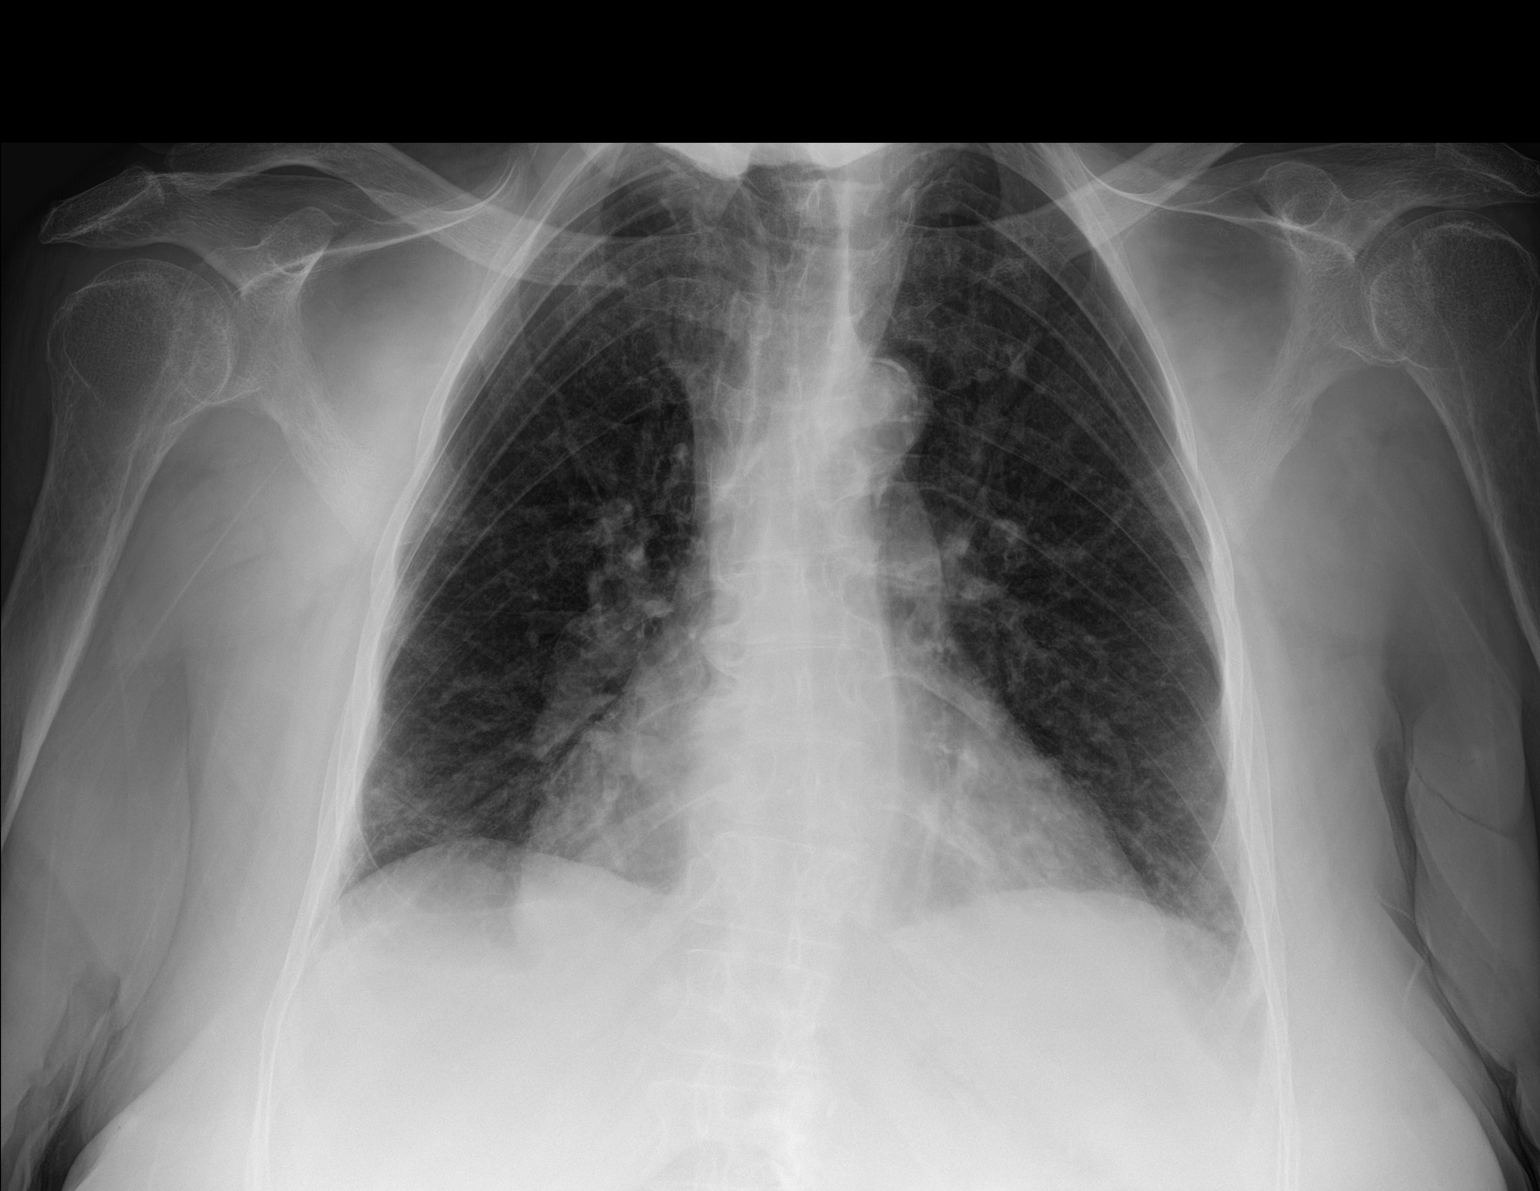

[chest lat]
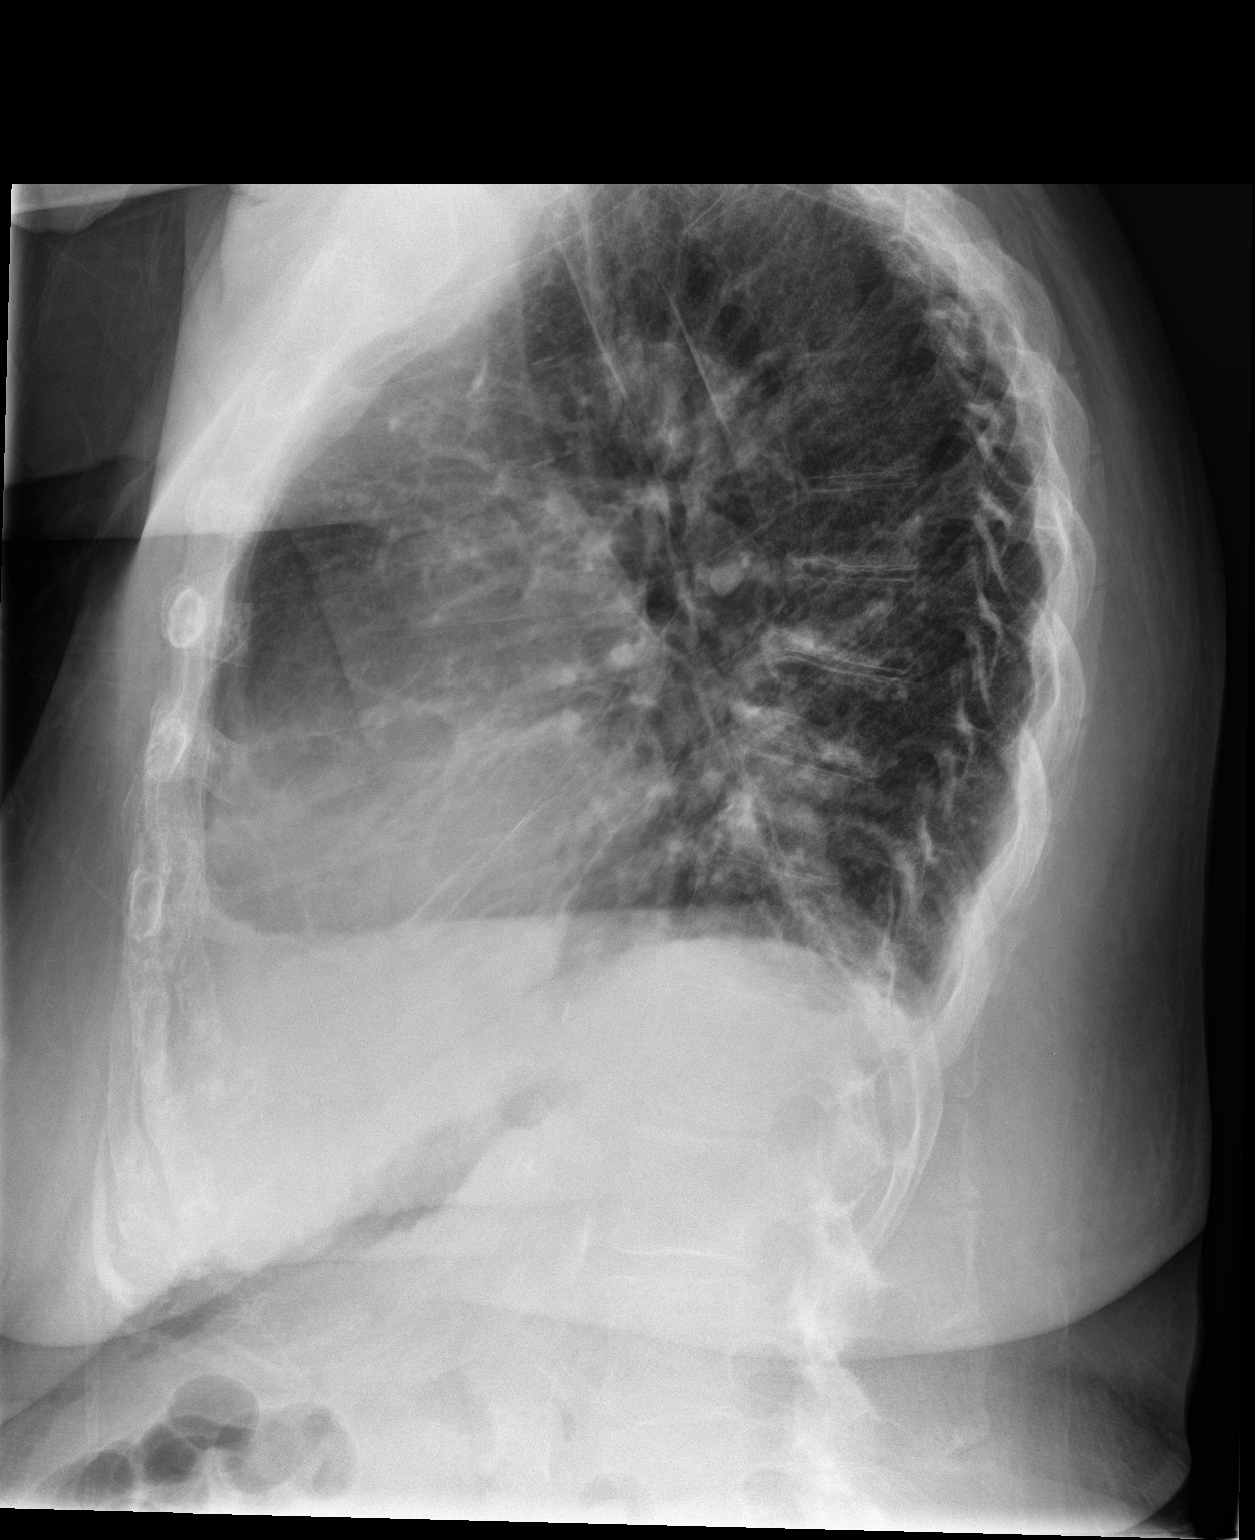

[2 of 2 positions shown; findings below may reference images not displayed]

FINDINGS: Cardiac silhouette is mildly enlarged. Mediastinal silhouette is
nonsuspicious, calcified aortic knob. Mild bronchitic changes
without pleural effusion. Patchy bibasilar airspace opacities. No
pneumothorax. Osteopenia.
IMPRESSION: Mild cardiomegaly. Mild bronchitic changes with bibasilar
atelectasis, less likely pneumonia.

## 2017-05-31 DEATH — deceased
